# Patient Record
Sex: Female | Born: 1935 | Race: Black or African American | Hispanic: No | Smoking: Former smoker
Health system: Southern US, Community
[De-identification: ages and names within clinical notes are randomized; demographics above are authoritative.]

## PROBLEM LIST (undated history)

## (undated) DIAGNOSIS — E039 Hypothyroidism, unspecified: Secondary | ICD-10-CM

## (undated) DIAGNOSIS — H9190 Unspecified hearing loss, unspecified ear: Secondary | ICD-10-CM

## (undated) DIAGNOSIS — J90 Pleural effusion, not elsewhere classified: Secondary | ICD-10-CM

## (undated) DIAGNOSIS — R4189 Other symptoms and signs involving cognitive functions and awareness: Secondary | ICD-10-CM

## (undated) DIAGNOSIS — C801 Malignant (primary) neoplasm, unspecified: Secondary | ICD-10-CM

## (undated) DIAGNOSIS — I10 Essential (primary) hypertension: Secondary | ICD-10-CM

## (undated) DIAGNOSIS — Z9889 Other specified postprocedural states: Secondary | ICD-10-CM

## (undated) DIAGNOSIS — C50919 Malignant neoplasm of unspecified site of unspecified female breast: Secondary | ICD-10-CM

## (undated) HISTORY — DX: Other specified postprocedural states: Z98.890

## (undated) HISTORY — DX: Malignant (primary) neoplasm, unspecified: C80.1

## (undated) HISTORY — PX: CATARACT EXTRACTION: SUR2

## (undated) HISTORY — DX: Malignant neoplasm of unspecified site of unspecified female breast: C50.919

## (undated) HISTORY — PX: DILATATION & CURETTAGE/HYSTEROSCOPY WITH MYOSURE: SHX6511

## (undated) HISTORY — DX: Unspecified hearing loss, unspecified ear: H91.90

## (undated) HISTORY — DX: Other symptoms and signs involving cognitive functions and awareness: R41.89

## (undated) HISTORY — DX: Essential (primary) hypertension: I10

---

## 1998-04-02 ENCOUNTER — Ambulatory Visit (HOSPITAL_COMMUNITY): Admission: RE | Admit: 1998-04-02 | Discharge: 1998-04-02 | Payer: Self-pay | Admitting: Cardiology

## 1998-10-14 ENCOUNTER — Other Ambulatory Visit: Admission: RE | Admit: 1998-10-14 | Discharge: 1998-10-14 | Payer: Self-pay | Admitting: Obstetrics and Gynecology

## 2000-04-05 ENCOUNTER — Other Ambulatory Visit: Admission: RE | Admit: 2000-04-05 | Discharge: 2000-04-05 | Payer: Self-pay | Admitting: Obstetrics and Gynecology

## 2000-06-30 ENCOUNTER — Encounter: Payer: Self-pay | Admitting: Cardiology

## 2000-06-30 ENCOUNTER — Encounter: Admission: RE | Admit: 2000-06-30 | Discharge: 2000-06-30 | Payer: Self-pay | Admitting: Cardiology

## 2000-07-21 ENCOUNTER — Encounter: Payer: Self-pay | Admitting: Internal Medicine

## 2000-07-21 ENCOUNTER — Ambulatory Visit (HOSPITAL_COMMUNITY): Admission: RE | Admit: 2000-07-21 | Discharge: 2000-07-21 | Payer: Self-pay | Admitting: Internal Medicine

## 2000-08-26 ENCOUNTER — Encounter: Payer: Self-pay | Admitting: Internal Medicine

## 2000-08-26 DIAGNOSIS — K648 Other hemorrhoids: Secondary | ICD-10-CM | POA: Insufficient documentation

## 2000-08-30 ENCOUNTER — Ambulatory Visit (HOSPITAL_COMMUNITY): Admission: RE | Admit: 2000-08-30 | Discharge: 2000-08-30 | Payer: Self-pay | Admitting: Internal Medicine

## 2000-08-30 ENCOUNTER — Encounter: Payer: Self-pay | Admitting: Internal Medicine

## 2001-04-20 ENCOUNTER — Other Ambulatory Visit: Admission: RE | Admit: 2001-04-20 | Discharge: 2001-04-20 | Payer: Self-pay | Admitting: Obstetrics and Gynecology

## 2002-08-16 ENCOUNTER — Other Ambulatory Visit: Admission: RE | Admit: 2002-08-16 | Discharge: 2002-08-16 | Payer: Self-pay | Admitting: Obstetrics and Gynecology

## 2003-02-02 ENCOUNTER — Encounter: Admission: RE | Admit: 2003-02-02 | Discharge: 2003-02-02 | Payer: Self-pay | Admitting: Cardiology

## 2003-02-02 ENCOUNTER — Encounter: Payer: Self-pay | Admitting: Cardiology

## 2003-07-13 ENCOUNTER — Encounter (HOSPITAL_BASED_OUTPATIENT_CLINIC_OR_DEPARTMENT_OTHER): Payer: Self-pay | Admitting: General Surgery

## 2003-07-16 ENCOUNTER — Encounter (INDEPENDENT_AMBULATORY_CARE_PROVIDER_SITE_OTHER): Payer: Self-pay | Admitting: *Deleted

## 2003-07-16 ENCOUNTER — Ambulatory Visit (HOSPITAL_COMMUNITY): Admission: RE | Admit: 2003-07-16 | Discharge: 2003-07-16 | Payer: Self-pay | Admitting: General Surgery

## 2003-07-16 ENCOUNTER — Encounter (HOSPITAL_BASED_OUTPATIENT_CLINIC_OR_DEPARTMENT_OTHER): Payer: Self-pay | Admitting: General Surgery

## 2003-07-31 ENCOUNTER — Other Ambulatory Visit: Admission: RE | Admit: 2003-07-31 | Discharge: 2003-07-31 | Payer: Self-pay | Admitting: Obstetrics and Gynecology

## 2003-08-14 ENCOUNTER — Ambulatory Visit (HOSPITAL_COMMUNITY): Admission: RE | Admit: 2003-08-14 | Discharge: 2003-08-14 | Payer: Self-pay | Admitting: General Surgery

## 2003-08-14 ENCOUNTER — Encounter (INDEPENDENT_AMBULATORY_CARE_PROVIDER_SITE_OTHER): Payer: Self-pay | Admitting: Specialist

## 2003-08-17 ENCOUNTER — Ambulatory Visit: Admission: RE | Admit: 2003-08-17 | Discharge: 2003-10-24 | Payer: Self-pay | Admitting: *Deleted

## 2003-11-20 ENCOUNTER — Ambulatory Visit: Admission: RE | Admit: 2003-11-20 | Discharge: 2003-11-20 | Payer: Self-pay | Admitting: *Deleted

## 2003-11-24 HISTORY — PX: BREAST LUMPECTOMY: SHX2

## 2004-05-23 ENCOUNTER — Encounter: Admission: RE | Admit: 2004-05-23 | Discharge: 2004-05-23 | Payer: Self-pay | Admitting: Cardiology

## 2004-05-23 ENCOUNTER — Ambulatory Visit: Admission: RE | Admit: 2004-05-23 | Discharge: 2004-05-23 | Payer: Self-pay | Admitting: *Deleted

## 2004-06-06 ENCOUNTER — Ambulatory Visit: Admission: RE | Admit: 2004-06-06 | Discharge: 2004-06-06 | Payer: Self-pay | Admitting: *Deleted

## 2004-08-05 ENCOUNTER — Other Ambulatory Visit: Admission: RE | Admit: 2004-08-05 | Discharge: 2004-08-05 | Payer: Self-pay | Admitting: Obstetrics and Gynecology

## 2004-08-18 ENCOUNTER — Encounter: Admission: RE | Admit: 2004-08-18 | Discharge: 2004-11-16 | Payer: Self-pay | Admitting: Obstetrics and Gynecology

## 2004-10-31 ENCOUNTER — Encounter: Admission: RE | Admit: 2004-10-31 | Discharge: 2004-10-31 | Payer: Self-pay | Admitting: Cardiology

## 2005-05-18 ENCOUNTER — Encounter: Admission: RE | Admit: 2005-05-18 | Discharge: 2005-05-18 | Payer: Self-pay | Admitting: Cardiology

## 2005-07-06 ENCOUNTER — Emergency Department (HOSPITAL_COMMUNITY): Admission: EM | Admit: 2005-07-06 | Discharge: 2005-07-06 | Payer: Self-pay | Admitting: Family Medicine

## 2005-08-11 ENCOUNTER — Other Ambulatory Visit: Admission: RE | Admit: 2005-08-11 | Discharge: 2005-08-11 | Payer: Self-pay | Admitting: Obstetrics and Gynecology

## 2006-08-23 ENCOUNTER — Other Ambulatory Visit: Admission: RE | Admit: 2006-08-23 | Discharge: 2006-08-23 | Payer: Self-pay | Admitting: Obstetrics and Gynecology

## 2006-08-30 ENCOUNTER — Ambulatory Visit: Payer: Self-pay | Admitting: Internal Medicine

## 2006-10-11 ENCOUNTER — Encounter: Admission: RE | Admit: 2006-10-11 | Discharge: 2006-10-11 | Payer: Self-pay | Admitting: Obstetrics and Gynecology

## 2007-05-11 ENCOUNTER — Encounter: Admission: RE | Admit: 2007-05-11 | Discharge: 2007-05-11 | Payer: Self-pay | Admitting: Orthopedic Surgery

## 2007-06-28 ENCOUNTER — Encounter: Admission: RE | Admit: 2007-06-28 | Discharge: 2007-06-28 | Payer: Self-pay | Admitting: Orthopedic Surgery

## 2007-07-05 ENCOUNTER — Encounter: Admission: RE | Admit: 2007-07-05 | Discharge: 2007-08-30 | Payer: Self-pay | Admitting: Orthopedic Surgery

## 2007-08-30 ENCOUNTER — Ambulatory Visit: Payer: Self-pay | Admitting: Internal Medicine

## 2008-02-02 DIAGNOSIS — Z853 Personal history of malignant neoplasm of breast: Secondary | ICD-10-CM

## 2008-02-29 ENCOUNTER — Ambulatory Visit (HOSPITAL_COMMUNITY): Admission: RE | Admit: 2008-02-29 | Discharge: 2008-02-29 | Payer: Self-pay | Admitting: Cardiology

## 2008-05-10 ENCOUNTER — Encounter: Admission: RE | Admit: 2008-05-10 | Discharge: 2008-05-10 | Payer: Self-pay | Admitting: Orthopedic Surgery

## 2008-11-23 HISTORY — PX: CATARACT EXTRACTION, BILATERAL: SHX1313

## 2009-11-14 ENCOUNTER — Ambulatory Visit (HOSPITAL_COMMUNITY): Admission: RE | Admit: 2009-11-14 | Discharge: 2009-11-14 | Payer: Self-pay | Admitting: Ophthalmology

## 2009-11-23 HISTORY — PX: APPENDECTOMY: SHX54

## 2010-02-04 ENCOUNTER — Encounter (INDEPENDENT_AMBULATORY_CARE_PROVIDER_SITE_OTHER): Payer: Self-pay | Admitting: *Deleted

## 2010-02-04 ENCOUNTER — Inpatient Hospital Stay (HOSPITAL_COMMUNITY): Admission: EM | Admit: 2010-02-04 | Discharge: 2010-02-06 | Payer: Self-pay | Admitting: Emergency Medicine

## 2010-02-05 ENCOUNTER — Encounter (INDEPENDENT_AMBULATORY_CARE_PROVIDER_SITE_OTHER): Payer: Self-pay | Admitting: *Deleted

## 2010-02-06 ENCOUNTER — Encounter (INDEPENDENT_AMBULATORY_CARE_PROVIDER_SITE_OTHER): Payer: Self-pay | Admitting: General Surgery

## 2010-05-12 ENCOUNTER — Telehealth: Payer: Self-pay | Admitting: Internal Medicine

## 2010-12-13 ENCOUNTER — Encounter: Payer: Self-pay | Admitting: Obstetrics and Gynecology

## 2010-12-14 ENCOUNTER — Encounter: Payer: Self-pay | Admitting: Obstetrics and Gynecology

## 2010-12-25 NOTE — Op Note (Signed)
Summary: Appendectomy    NAME:  Holly Patterson, Holly Patterson           ACCOUNT NO.:  [id number]      MEDICAL RECORD NO.:  [id number]          PATIENT TYPE:  EMS      LOCATION:  MAJO                         FACILITY:  [facility]      PHYSICIAN:  Cherylynn Ridges, M.D.    DATE OF BIRTH:  January 28, 1921      DATE OF PROCEDURE:  02/06/2010   DATE OF DISCHARGE:  02/05/2010                                  OPERATIVE REPORT      PREOPERATIVE DIAGNOSIS:  Acute appendicitis.      POSTOPERATIVE DIAGNOSIS:  Acute appendicitis without perforation.      PROCEDURE:  Laparoscopic appendectomy.      SURGEON:  Marta Lamas. Lindie Spruce, MD      ANESTHESIA:  General endotracheal.      ESTIMATED BLOOD LOSS:  Less than 20 mL.      COMPLICATIONS:  None.      CONDITION:  Stable.      INDICATIONS FOR OPERATION:  The patient is an 77 year old very healthy   female with a CT scan diagnosed acute appendicitis based on the the   patient's complaints who now comes to the operating room for   laparoscopic appendectomy.      FINDINGS:  Today she had an acutely inflamed nonperforated appendix that   was mildly attached to the terminal ileum.  No evidence of any intra-   abdominal fluid or pus.      OPERATION:  The patient was taken to the operating room, placed on table   in supine position.  After an adequate general endotracheal anesthetic   was administered she was prepped and draped in usual sterile manner   exposing entire abdomen.      A supraumbilical midline incision was made using a #15 blade taken down   to the midline fascia.  We grabbed the fascia with 2 Kocher clamps and   then incised between the clamps using a 15 blade into the preperitoneal   space.      While tenting up on the fascia, we bluntly dissected down through the   preperitoneal space into the peritoneal cavity.  Once this was done a   pursestring suture of O Vicryl was passed around the fascial opening   into the peritoneal cavity securing in a Hasson  cannula, which was   subsequently passed.  Through the Hasson cannula, carbon dioxide gas was   insufflated into the peritoneal cavity up to a maximal intra-abdominal   pressure of 50 mmHg.      Once we insufflated with gas, we placed the patient in Trendelenburg,   the left-side was tilted down.  We inspected the right lower quadrant   where there appeared to be some gathering of tissue.  A 5-mm right upper   quadrant cannula was passed under direct vision and a left lower   quadrant 12-mm cannula passed under direct vision.  Once this was done,   we were able to manipulate the cecum and terminal ileum in order to   identify what appeared to be an acutely inflamed  appendix that was   somewhat attached to the terminal ileum by adhesions and inflammation.   We were able to free this up with just blunt dissection without injuring   the terminal ileum.  We dissected out the appendix itself from the base   of the cecum using dissection, making a window between the mesoappendix   and the cecum itself.  Once this was done an Endo-GIA blue 3.5-mm   closure Endo-GIA was passed across the base of the appendix and at the   cecum.  This detached it completely from the cecum.  We then passed a   2.5 mm vascular Endo GIA across the mesoappendix.  This completely   detached the appendix which we retrieved from the left lower quadrant   incision site using an Endo-GIA catch bag.  Once this was done, we   removed the appendix.  We repassed the cannula back in its place.  There   was minimal to no bleeding from the mesentery.  We irrigated with   perhaps of 50-100 mL of saline.  Once that was done, completely we   aspirated all fluid and gas from above the liver.  We used a purse-   string suture, which was in place in order to tie off the supraumbilical   fascial site.  Once this was done we aspirated all fluid and gas from   above the liver, removed all cannulas.  All counts were correct.  We    injected 0.7 Marcaine with epi at all sites.  We closed the   supraumbilical fascial site using running subcuticular stitch of 4-0   Monocryl.  The left lower quadrant cannula site was also closed with   skin site using Monocryl.  Dermabond, Steri-Strips and Tegaderm were   used to complete the dressing.  All counts were correct.               Cherylynn Ridges, M.D.            JOW/MEDQ  D:  02/06/2010  T:  02/06/2010  Job:  [id number]      cc:   Osvaldo Shipper. Spruill, M.D.      Electronically Signed by Jimmye Norman M.D. on 02/25/2010 09:57:58 PM

## 2010-12-25 NOTE — Procedures (Signed)
Summary: Soil scientist   Imported By: Sherian Rein 05/12/2010 08:59:15  _____________________________________________________________________  External Attachment:    Type:   Image     Comment:   External Document

## 2010-12-25 NOTE — Progress Notes (Signed)
Summary: Change GI Dr  Phone Note Outgoing Call   Call placed by: Lamona Curl CMA Duncan Dull),  May 12, 2010 9:09 AM Call placed to: Patient Daughter Summary of Call: I have spoken to Dr Excell Seltzer, patient's daughter who originally scheduled an appointment for patient to see Dr Juanda Chance for heme positive stools. Dr Excell Seltzer would now like me to cancel the pending appointment for tommorrow as patient will be seeing Dr Loreta Ave instead. I have cancelled the appointment. Initial call taken by: Lamona Curl CMA (AAMA),  May 12, 2010 9:11 AM

## 2011-01-07 DIAGNOSIS — R9389 Abnormal findings on diagnostic imaging of other specified body structures: Secondary | ICD-10-CM | POA: Diagnosis present

## 2011-02-13 LAB — COMPREHENSIVE METABOLIC PANEL
ALT: 27 U/L (ref 0–35)
AST: 30 U/L (ref 0–37)
CO2: 26 mEq/L (ref 19–32)
Calcium: 9.1 mg/dL (ref 8.4–10.5)
Chloride: 105 mEq/L (ref 96–112)
Creatinine, Ser: 1.61 mg/dL — ABNORMAL HIGH (ref 0.4–1.2)
GFR calc non Af Amer: 30 mL/min — ABNORMAL LOW (ref >60)
Glucose, Bld: 169 mg/dL — ABNORMAL HIGH (ref 70–99)
Total Bilirubin: 0.8 mg/dL (ref 0.3–1.2)

## 2011-02-13 LAB — CBC
Hemoglobin: 10.7 g/dL — ABNORMAL LOW (ref 12.0–15.0)
MCHC: 31.9 g/dL (ref 30.0–36.0)
MCHC: 32.8 g/dL (ref 30.0–36.0)
MCV: 76.5 fL — ABNORMAL LOW (ref 78.0–100.0)
MCV: 76.7 fL — ABNORMAL LOW (ref 78.0–100.0)
Platelets: 173 10*3/uL (ref 150–400)
RBC: 3.74 MIL/uL — ABNORMAL LOW (ref 3.87–5.11)
RBC: 4.36 MIL/uL (ref 3.87–5.11)
WBC: 12.5 10*3/uL — ABNORMAL HIGH (ref 4.0–10.5)
WBC: 9.1 10*3/uL (ref 4.0–10.5)

## 2011-02-13 LAB — DIFFERENTIAL
Basophils Absolute: 0 10*3/uL (ref 0.0–0.1)
Eosinophils Absolute: 0 10*3/uL (ref 0.0–0.7)
Eosinophils Relative: 0 % (ref 0–5)
Lymphs Abs: 1.2 10*3/uL (ref 0.7–4.0)
Neutrophils Relative %: 88 % — ABNORMAL HIGH (ref 43–77)

## 2011-02-13 LAB — URINALYSIS, ROUTINE W REFLEX MICROSCOPIC
Glucose, UA: NEGATIVE mg/dL
Leukocytes, UA: NEGATIVE
Specific Gravity, Urine: 1.024 (ref 1.005–1.030)
pH: 5 (ref 5.0–8.0)

## 2011-02-13 LAB — BASIC METABOLIC PANEL
BUN: 23 mg/dL (ref 6–23)
CO2: 27 mEq/L (ref 19–32)
Calcium: 8.3 mg/dL — ABNORMAL LOW (ref 8.4–10.5)
Chloride: 105 mEq/L (ref 96–112)
Creatinine, Ser: 1.33 mg/dL — ABNORMAL HIGH (ref 0.4–1.2)
GFR calc Af Amer: 46 mL/min — ABNORMAL LOW (ref >60)
Glucose, Bld: 129 mg/dL — ABNORMAL HIGH (ref 70–99)

## 2011-02-13 LAB — URINE MICROSCOPIC-ADD ON

## 2011-02-13 LAB — PROTIME-INR: Prothrombin Time: 13.6 seconds (ref 11.6–15.2)

## 2011-02-13 LAB — TYPE AND SCREEN
ABO/RH(D): A POS
Antibody Screen: NEGATIVE

## 2011-02-13 LAB — ABO/RH: ABO/RH(D): A POS

## 2011-02-13 LAB — LIPASE, BLOOD: Lipase: 24 U/L (ref 11–59)

## 2011-02-23 LAB — COMPREHENSIVE METABOLIC PANEL
AST: 28 U/L (ref 0–37)
Albumin: 3.5 g/dL (ref 3.5–5.2)
BUN: 22 mg/dL (ref 6–23)
Creatinine, Ser: 1.43 mg/dL — ABNORMAL HIGH (ref 0.4–1.2)
GFR calc Af Amer: 42 mL/min — ABNORMAL LOW (ref >60)
Total Protein: 6.7 g/dL (ref 6.0–8.3)

## 2011-02-23 LAB — CBC
HCT: 33.8 % — ABNORMAL LOW (ref 36.0–46.0)
MCV: 75.8 fL — ABNORMAL LOW (ref 78.0–100.0)
Platelets: 191 10*3/uL (ref 150–400)
RDW: 15.4 % (ref 11.5–15.5)

## 2011-02-23 LAB — URINALYSIS, ROUTINE W REFLEX MICROSCOPIC
Glucose, UA: NEGATIVE mg/dL
Hgb urine dipstick: NEGATIVE
Nitrite: NEGATIVE
Specific Gravity, Urine: 1.025 (ref 1.005–1.030)
pH: 5.5 (ref 5.0–8.0)

## 2011-02-23 LAB — DIFFERENTIAL
Lymphocytes Relative: 23 % (ref 12–46)
Lymphs Abs: 1.2 10*3/uL (ref 0.7–4.0)
Monocytes Absolute: 0.5 10*3/uL (ref 0.1–1.0)
Monocytes Relative: 9 % (ref 3–12)
Neutro Abs: 3.3 10*3/uL (ref 1.7–7.7)

## 2011-03-31 ENCOUNTER — Inpatient Hospital Stay (HOSPITAL_COMMUNITY): Admission: RE | Admit: 2011-03-31 | Payer: Self-pay | Source: Ambulatory Visit | Admitting: Obstetrics and Gynecology

## 2011-03-31 ENCOUNTER — Ambulatory Visit (HOSPITAL_COMMUNITY): Admission: RE | Admit: 2011-03-31 | Payer: Self-pay | Source: Ambulatory Visit | Admitting: Obstetrics and Gynecology

## 2011-04-07 NOTE — Assessment & Plan Note (Signed)
[facility]                         GASTROENTEROLOGY OFFICE NOTE   Holly Patterson, Holly Patterson                  MRN:          [id number]  DATE:08/30/2007                            DOB:          August 20, 1921    [name] is an 77 year old African American female who we saw in the  past for functional constipation.  She has a personal history of breast  cancer and had an entirely normal colonoscopy in 2001.  At the last  visit a year ago, we decided that we were going to call her back for a  recall colonoscopy this fall.  This fall, the patient is feeling fine.  There is no constipation; her bowel habits are once or twice a day.  She  denies rectal bleeding or abdominal pain.  Her appetite has been good.   MEDICATIONS:  1. Synthroid 50/75 mg p.o. every other day.  2. Fosamax 70 mg weekly.  3. Benicar 25/12 mg every 3 days.  4. Vitamins.   PHYSICAL EXAMINATION:  Blood pressure 122/68, pulse 64 and weight 131  pounds.  The patient was alert and oriented, in no distress.  LUNGS:  Clear to auscultation.  COR:  Normal S1 and S2.  ABDOMEN:  Soft and nontender with normoactive bowel sounds.  RECTAL:  Exam today shows normal to slightly decreased rectal tone.  Stool was felt Hemoccult-negative.   IMPRESSION:  [age]-year-old African American female with personal  history of breast cancer and normal colonoscopy in 2001.  Her recall  colonoscopy has been between 7-10 years.  Since she is asymptomatic and  heme-negative, I do not think there is any need to do the colonoscopy at  9 years.  A 10-year recall would be at the age of 50, which is probably  not necessary at that age to do a colonoscopy.  We have discussed it  today and she is perfectly comfortable with the decision.  She will call  us if she has any gastrointestinal issues.     Hedwig Morton. Juanda Chance, MD  Electronically Signed    DMB/MedQ  DD: 08/30/2007  DT: 08/31/2007  Job #: [id number]   cc:    Osvaldo Shipper. Spruill, M.D.

## 2011-04-10 NOTE — Op Note (Signed)
NAME:  TERRYE, DOMBROSKY                     ACCOUNT NO.:  [id number]   MEDICAL RECORD NO.:  [id number]                   PATIENT TYPE:  OIB   LOCATION:  2899                                 FACILITY:  [facility]   PHYSICIAN:  Leonie Man, M.D.                DATE OF BIRTH:  May 03, 1921   DATE OF PROCEDURE:  07/16/2003  DATE OF DISCHARGE:                                 OPERATIVE REPORT   PREOPERATIVE DIAGNOSIS:  Invasive lobular carcinoma of the right breast.   POSTOPERATIVE DIAGNOSIS:  Invasive lobular carcinoma of the right breast.   PROCEDURE:  Right lumpectomy with sentinel lymph node dissection.   SURGEON:  Leonie Man, M.D.   ASSISTANT:  Nurse.   ANESTHESIA:  General.   INDICATIONS FOR PROCEDURE:  This patient is a 77 year old woman who on  routine mammogram is noted to have an irregular spiculated lesion in the 10  o'clock axis of the right breast approximately 6 cm from the right nipple.  She underwent an ultrasound guided core biopsy of the lesion which shows an  infiltrating lobular carcinoma. She comes to the operating room now for  needle localization and a radionucleotide injection for right-sided  lumpectomy with sentinel lymph node dissection and possible axillary lymph  node dissection. All the risks and potential benefits of surgery have been  fully discussed. All questions were answered and consent was obtained for  surgery.   DESCRIPTION OF PROCEDURE:  Following the induction of satisfactory general  endotracheal anesthesia the periareolar region was infiltrated with 4 mL of  Lymphazurin dye and massaged into the lymphatics of  the right breast. It  should be noted that had previously been injected with radionucleotide dye.  The right breast was then prepped and draped to be included  in the sterile  operative field.   I used the Neoprobe to locate an area in the axilla with high counts and  marked that area so as to use that to guide me towards the  sentinel node.  The localizing needle was then incised down upon with a small elliptical  incision. It was deepened through the skin and subcutaneous tissue and  dissection was carried down around the needle, carrying  the dissection all  the way down to the pectoralis muscle and to the serratus anterior muscles.   The entire lump was removed and forwarded for radiologic evaluation.  Specimen mammography showed the lesion to be well contained within the  specimen removed. The specimen was then forwarded to pathology for  pathologic evaluation. Touch preps on the specimen showed no evidence of  tumor at the margins.   A right axillary incision was made at approximately  the end of the axillary  hair line, deepening this through the skin and subcutaneous tissue and using  the Neoprobe to guide me toward the sentinel node. The highest counts  received were approximately  1500 to 1700 in the area of a  blue, hot node.  This node was dissected free from the surrounding  tissues and forwarded for  pathologic evaluation. Touch prep on this node was negative for metastatic  tumor.   Another node which was not blue or hot was located right next  to the  sentinel node. This was easily removed, so it was removed and forwarded for  pathologic evaluation  and it will be on permanents.   Both the axillary and breast wounds were thoroughly checked for hemostasis.  Additional bleeding points were treated with electrocautery. All sponge,  instrument and sharp counts were doubly verified. The subcutaneous tissues  of the axilla were closed with interrupted 3-0 Vicryl suture. The skin was  closed with a running 5-0 Monocryl. Also the subcutaneous tissues of the  breast were closed with interrupted 3-0 Vicryl sutures and the skin was  closed with a 5-0 running Monocryl suture. Both wounds were reinforced with  Steri-Strips. Sterile compressive dressings were applied.   The anesthesia was reversed. The  patient was removed from the operating room  to the recovery room in stable condition. She tolerated  the procedure well.                                               Leonie Man, M.D.    PB/MEDQ  D:  07/16/2003  T:  07/16/2003  Job:  [id number]   cc:   Jeralyn Ruths, M.D.

## 2011-04-10 NOTE — Assessment & Plan Note (Signed)
[facility]                           GASTROENTEROLOGY OFFICE NOTE   [name], [name]                  MRN:          [id number]  DATE:08/30/2006                            DOB:          1921/10/06    Holly Patterson is a delightful 77 year old patient of Dr. Shana Chute who comes  today for checkup of her GI tract.  We saw her initially on October 2001 for  colorectal screening.  Colonoscopy at that time was essentially normal.  She  since then has developed breast cancer and was treated by Dr. Darnelle Catalan.  She  underwent right lumpectomy and sentinel lymph node biopsy in August 2004.  She had radiation therapy following that.   She is due for colonoscopy in October 2008.  The patient has been on  Synthroid 50/75 mcg daily, Fosamax 70 mg weekly and Benicar 25/12 mg every  three days.  She denies some dysphagia, odynophagia, symptoms of reflux or  chest pain.   PHYSICAL EXAMINATION:  Blood pressure 106/62, pulse 76 and weight 135 pounds  which is exactly the same weight as in 2001.  Lungs were clear to  auscultation.  Cor with normal S1 and normal S2.  Abdomen was soft and  nontender with normal active bowel sounds.  Liver edge at costal margin.  Abdomen was normal.  Rectal exam showed soft, hemoccult negative stool.   IMPRESSION:  An 77 year old African American female with history of breast  cancer, due for colonoscopy October 2008.  She is currently heme negative.  There was a transient episode of constipation related to some of her  medications but she is back to normal bowel habits.   PLAN:  High fiber diet.  Colonoscopy October 2008.  She will receive a  recall letter from [place].       Holly Patterson. Juanda Chance, MD      DMB/MedQ  DD:  08/30/2006  DT:  08/31/2006  Job #:  [id number]   cc:   Osvaldo Shipper. Spruill, M.D.

## 2011-04-10 NOTE — Op Note (Signed)
   NAME:  Holly Patterson, Holly Patterson                     ACCOUNT NO.:  [id number]   MEDICAL RECORD NO.:  [id number]                   PATIENT TYPE:  OIB   LOCATION:  2899                                 FACILITY:  [facility]   PHYSICIAN:  Leonie Man, M.D.                DATE OF BIRTH:  11-06-1921   DATE OF PROCEDURE:  08/14/2003  DATE OF DISCHARGE:  08/14/2003                                 OPERATIVE REPORT   PREOPERATIVE DIAGNOSIS:  Residual carcinoma, right breast following  lumpectomy.   POSTOPERATIVE DIAGNOSIS:  Residual carcinoma, right breast following  lumpectomy, pathology pending.   OPERATION PERFORMED:  Re-excision of right lumpectomy site.   SURGEON:  Leonie Man, M.D.   ASSISTANT:  Nurse.   ANESTHESIA:  General.   INDICATIONS FOR PROCEDURE:  The patient is a 77 year old woman who underwent  lumpectomy and sentinel lymph node biopsy on July 16, 2003.  Pathology  reads that invasive __________ carcinoma approaches the anterior margin.  The patient returns now for re-excision of her biopsy site to complete total  excision of cancer.   DESCRIPTION OF PROCEDURE:  Following induction of satisfactory general  anesthesia, the patient was positioned supinely and the right breast was  prepped and draped  to be included in a sterile operative field.  An  elliptical incision was made around the old scar, deepening this through the  skin down to the subcutaneous tissues.  Dissection was carried superiorly to  the previous biopsy site.  The tissue was re-excised re-excising the entire  biopsy site down to the region of the pectoralis major muscles.  The  specimen was removed and the anterior margin marked with a long suture. The  3 o'clock margin marked with a double suture.  This was then forwarded for  pathologic evaluation.  Hemostasis was assured with electrocautery.  Sponge  and instrument counts were verified.  The wound closed in two layers using  interrupted 3-0 Vicryl  sutures in the subcutaneous tissues and 5-0 Monocryl  suture in the skin.  The wounds were reinforced with Steri-Strips.  Sterile  dressings were applied.  Anesthetic was reversed and the patient removed  from the operating room to the recovery room in stable condition.  She  tolerated the procedure well.                                                Leonie Man, M.D.    PB/MEDQ  D:  08/14/2003  T:  08/15/2003  Job:  [id number]

## 2011-05-07 ENCOUNTER — Other Ambulatory Visit: Payer: Self-pay | Admitting: Obstetrics and Gynecology

## 2011-05-07 ENCOUNTER — Encounter (HOSPITAL_COMMUNITY): Payer: Medicare Other | Attending: Obstetrics and Gynecology

## 2011-05-07 DIAGNOSIS — Z01812 Encounter for preprocedural laboratory examination: Secondary | ICD-10-CM | POA: Insufficient documentation

## 2011-05-07 DIAGNOSIS — Z01818 Encounter for other preprocedural examination: Secondary | ICD-10-CM | POA: Insufficient documentation

## 2011-05-07 LAB — CBC
HCT: 34 % — ABNORMAL LOW (ref 36.0–46.0)
Hemoglobin: 10.8 g/dL — ABNORMAL LOW (ref 12.0–15.0)
MCH: 24 pg — ABNORMAL LOW (ref 26.0–34.0)
MCV: 75.6 fL — ABNORMAL LOW (ref 78.0–100.0)
RBC: 4.5 MIL/uL (ref 3.87–5.11)

## 2011-05-15 ENCOUNTER — Ambulatory Visit (HOSPITAL_COMMUNITY)
Admission: RE | Admit: 2011-05-15 | Payer: Medicare Other | Source: Ambulatory Visit | Admitting: Obstetrics and Gynecology

## 2011-05-15 ENCOUNTER — Ambulatory Visit (HOSPITAL_COMMUNITY): Admission: RE | Admit: 2011-05-15 | Payer: Self-pay | Source: Ambulatory Visit | Admitting: Obstetrics and Gynecology

## 2011-06-01 ENCOUNTER — Other Ambulatory Visit (HOSPITAL_COMMUNITY): Payer: Self-pay | Admitting: Cardiology

## 2011-06-02 NOTE — H&P (Signed)
NAMEFRANCELY, Patterson NO.:  [id number]  MEDICAL RECORD NO.:  [id number]  LOCATION:                                 FACILITY:  PHYSICIAN:  Hal Morales, M.D.DATE OF BIRTH:  02/11/1921  DATE OF ADMISSION:  05/15/2011 DATE OF DISCHARGE:                             HISTORY & PHYSICAL   HISTORY OF PRESENT ILLNESS:  The patient is an 77 year old black widowed female, para 3-0-0-3 who presents for evaluation of fluid intracavitary on uterine ultrasound.  The patient is many years postmenopausal and has had no vaginal bleeding.  She has a remote history of low-grade squamous intraepithelial lesion which was treated with cold knife conization of the cervix with negative margins and has had normal Pap smears since that time.  She was seen in February 2012 because of progressive weight loss and underwent a pelvic ultrasound to rule out gynecologic contribution for abnormalities.  At that time, the uterus was notsignificantly enlarged at 5.3 x 4.5 cm and the ovaries were normal bilaterally.  There was no free fluid within the cul-de-sac. Endometrial cavity was filled with fluid and an endometrial thickness of 3.7 mm with an endometrial mass measuring 5.5 x 3.4 mm.  There was fluid filling the endometrial cavity.  In light of these findings, it was recommended that the patient undergo hysteroscopy and cytology of the endometrial fluid along with resection of the endometrial mass which had been noted.  The patient had delayed that procedure for several reasons, but presents now for that evaluation.  PAST MEDICAL HISTORY:  Obstetrical:  The patient has had 3 normal spontaneous vaginal deliveries.  Gynecologic:  The patient had a conization of the cervix in 1991 with noninvasive disease and clear margins.  Pap smears have been normal since that time.  The patient also has a history of lichen sclerosus on biopsy which has caused hypopigmentation of the vulvar area, but  no other symptomatology.  The patient was diagnosed with a right breast cancer in 2004 and underwent lumpectomy and no further therapy.  The patient had osteopenia diagnosed in 2005 on DEXA scan and was noted to have a chronic midthoracic vertebral compression fracture.  She was started on Fosamax at that time.  She has had good preservation of her bone density in the spine end.  MEDICAL HISTORY:  The patient was diagnosed with hyperthyroidism in 2001 and was treated with I-131 therapy and is now chronically on Synthroid with euthyroid serology.  She is treated for chronic hypertension.  SURGICAL HISTORY:  The patient underwent an appendectomy in March 2011 without difficulty.  CURRENT MEDICATIONS:  Benicar, Synthroid, Fosamax, calcium, and vitamin D.  DRUG ALLERGIES:  No known drug allergies.  FAMILY HISTORY:  Positive for sister with breast cancer, father with prostatic cancer, and grandmother with ovarian cancer as well as mother with breast cancer.  She also has a positive history for diabetes in her sister.  REVIEW OF SYSTEMS:  Significant for poor appetite and weight loss with approximately 17-pound weight loss noted in the last year.  SOCIAL HISTORY:  The patient is widowed.  She is a retired Radio producer.  She lives in her own home and her granddaughter lives with  her who is a Archivist.  PHYSICAL EXAMINATION:  GENERAL:  The patient is a thin black female in no acute distress. VITAL SIGNS:  Temperature 98, respirations 20, pulse 72, blood pressure is 120/64, and weight is 117 pounds. LUNGS:  Clear. HEART:  Regular rate and rhythm. ABDOMEN:  Soft without masses or organomegaly. PELVIC:  EGBUS shows hypopigmented area over the vulva which is stable. The vagina is atrophic.  The cervix is stenotic.  The uterus is normal size, shape, consistency, mobile, and nontender.  Adnexa, no masses.  IMPRESSION: 1. Endometrial fluid and endometrial mass noted on  ultrasound. 2. Weight loss. 3. History of breast cancer. 4. History of low-grade squamous intraepithelial lesion status post     conization of the cervix.  DISPOSITION:  Discussion was held with the patient and with her daughter and son concerning indications for her procedure as well as the risks involved which include but are not limited to anesthesia, bleeding, infection, and damage to adjacent organs.  This is particularly important in the light of the patient's stenotic cervix.  The importance of this endometrial evaluation has been reviewed and consent has been given by the patient and her children.  The procedure will be performed at [facility] on May 15, 2011.     Hal Morales, M.D.     VPH/MEDQ  D:  05/12/2011  T:  05/13/2011  Job:  [id number]  Electronically Signed by Dierdre Forth M.D. on 06/02/2011 06:59:23 AM

## 2011-06-08 ENCOUNTER — Ambulatory Visit (HOSPITAL_COMMUNITY): Payer: Medicare Other

## 2011-06-08 ENCOUNTER — Encounter (HOSPITAL_COMMUNITY)
Admission: RE | Admit: 2011-06-08 | Discharge: 2011-06-08 | Disposition: A | Discharge status: Home / Self Care | Payer: Medicare Other | Source: Ambulatory Visit | Attending: Cardiology | Admitting: Cardiology

## 2011-06-08 DIAGNOSIS — R079 Chest pain, unspecified: Secondary | ICD-10-CM | POA: Insufficient documentation

## 2011-06-08 MED ORDER — TECHNETIUM TC 99M TETROFOSMIN IV KIT
30.0000 | PACK | Freq: Once | INTRAVENOUS | Status: AC | PRN
  Administered 2011-06-08: 30 via INTRAVENOUS

## 2011-06-08 MED ORDER — TECHNETIUM TC 99M TETROFOSMIN IV KIT
10.0000 | PACK | Freq: Once | INTRAVENOUS | Status: AC | PRN
  Administered 2011-06-08: 10 via INTRAVENOUS

## 2011-06-11 ENCOUNTER — Encounter (INDEPENDENT_AMBULATORY_CARE_PROVIDER_SITE_OTHER): Payer: Self-pay | Admitting: Surgery

## 2011-06-16 ENCOUNTER — Other Ambulatory Visit: Payer: Self-pay | Admitting: Obstetrics and Gynecology

## 2011-06-19 ENCOUNTER — Telehealth (INDEPENDENT_AMBULATORY_CARE_PROVIDER_SITE_OTHER): Payer: Self-pay | Admitting: Surgery

## 2011-06-19 ENCOUNTER — Ambulatory Visit (INDEPENDENT_AMBULATORY_CARE_PROVIDER_SITE_OTHER): Payer: Medicare Other | Admitting: Surgery

## 2011-06-19 ENCOUNTER — Encounter (INDEPENDENT_AMBULATORY_CARE_PROVIDER_SITE_OTHER): Payer: Self-pay | Admitting: Surgery

## 2011-06-19 VITALS — BP 114/64 | HR 60 | Ht 62.0 in | Wt 124.0 lb

## 2011-06-19 DIAGNOSIS — R928 Other abnormal and inconclusive findings on diagnostic imaging of breast: Secondary | ICD-10-CM

## 2011-06-19 DIAGNOSIS — R921 Mammographic calcification found on diagnostic imaging of breast: Secondary | ICD-10-CM

## 2011-06-19 NOTE — Patient Instructions (Signed)
We will work with Dr. Lilian Coma office to coordinate our surgery with hers.

## 2011-06-19 NOTE — Progress Notes (Signed)
Holly Patterson is a 77 y.o. female.    Chief Complaint  Patient presents with  . eval breast scarring    HPI HPI This patient was operated on in 2004 by Holly Patterson for a 0.8 cm grade 1 invasive lobular carcinoma. The original excision it extended focally to the anterior margin and she subsequently underwent a reexcision which had no residual cancer. Two sentinel lymph nodes were negative.  On routine followup she has been developing a tiny bit of abnormal calcifications in the area of the prior lumpectomy. She would be a difficult for a core biopsy because of the location. The radiologist suggested either BSGI or needle locator excision. The patient would like to have an excision. She is already scheduled for surgery by Dr. Penni Bombard for a hysteroscopy on August 16 and would like to have the surgery done at the same time. She is having no current symptoms with her breast.  Past Medical History  Diagnosis Date  . History of conization of cervix   . Cancer rt breast cancer 2004  . Hyperthyroidism     in 2001  . Hypertension     Past Surgical History  Procedure Date  . Appendectomy 2011  . Cataract extraction, bilateral 2010  . Breast lumpectomy 2005    Family History  Problem Relation Age of Onset  . Prostate cancer Father   . Nephrotic syndrome Sister   . Breast cancer Sister   . Breast cancer Mother   . Pancreatic cancer Sister     Social History History  Substance Use Topics  . Smoking status: Never Smoker   . Smokeless tobacco: Not on file  . Alcohol Use: Yes     "wine once or twice a month"    No Known Allergies  Current Outpatient Prescriptions  Medication Sig Dispense Refill  . alendronate (FOSAMAX) 10 MG tablet Take 10 mg by mouth daily before breakfast. Take with a full glass of water on an empty stomach.       . Calcium Carbonate-Vitamin D (CALCIUM-VITAMIN D) 500-200 MG-UNIT per tablet Take 1 tablet by mouth 2 (two) times daily with a meal.        .  levothyroxine (SYNTHROID, LEVOTHROID) 100 MCG tablet Take 100 mcg by mouth daily.        Marland Kitchen olmesartan (BENICAR) 20 MG tablet Take 20 mg by mouth daily.        . calcium & magnesium carbonates (MYLANTA) 311-232 MG per tablet Take 1 tablet by mouth daily.        . EXELON 4.6 MG/24HR         Review of Systems Review of Systems  Constitutional: Negative.   HENT: Negative.   Eyes: Negative.   Respiratory: Negative.   Gastrointestinal: Negative.   Genitourinary: Negative.   Skin: Negative.   Neurological: Negative.   Endo/Heme/Allergies: Negative.   Psychiatric/Behavioral: Positive for memory loss.    Physical Exam Physical Exam  Constitutional: She is oriented to person, place, and time. She appears well-developed and well-nourished.  HENT:  Head: Normocephalic and atraumatic.  Eyes: Pupils are equal, round, and reactive to light. No scleral icterus.  Neck: Neck supple. No tracheal deviation present. No thyromegaly present.  Cardiovascular: Normal rate and regular rhythm.   Respiratory: Effort normal and breath sounds normal.    GI: Soft. She exhibits no distension. There is no tenderness. There is no rebound.  Musculoskeletal: Normal range of motion.  Neurological: She is alert and oriented to person, place, and  time.  Skin: Skin is warm and dry.     Blood pressure 114/64, pulse 60, height 5\' 2"  (1.575 m), weight 124 lb (56.246 kg). Data reviewed: I've reviewed our old office records which the information from when she was operated on previously for her breast cancer. I have also reviewed the reports and films of her mammography. Assessment/Plan Impression: Abnormal calcifications developing in a site of a prior lumpectomy for ILC. Plan: I think it would be reasonable to do a needle guided excisional biopsy. I discussed the patient as well as her daughter. Spoke with her son about this by phone and will talk to him again later today. Think all questions have been  answered. [name] J 06/19/2011, 12:19 PM

## 2011-06-22 ENCOUNTER — Telehealth (INDEPENDENT_AMBULATORY_CARE_PROVIDER_SITE_OTHER): Payer: Self-pay

## 2011-06-22 NOTE — Telephone Encounter (Signed)
I received a call from [facility] at  Dr Corine Shelter office in [place] and Dr Corine Shelter would like Dr Jamey Ripa to call the dr at his convenience [phone]

## 2011-06-23 NOTE — Telephone Encounter (Signed)
Never got a responds back, de

## 2011-07-03 ENCOUNTER — Encounter (HOSPITAL_COMMUNITY)
Admission: RE | Admit: 2011-07-03 | Discharge: 2011-07-03 | Disposition: A | Discharge status: Home / Self Care | Payer: Medicare Other | Source: Ambulatory Visit | Attending: Obstetrics and Gynecology | Admitting: Obstetrics and Gynecology

## 2011-07-03 ENCOUNTER — Encounter (HOSPITAL_COMMUNITY): Payer: Self-pay

## 2011-07-03 LAB — CBC
HCT: 33.4 % — ABNORMAL LOW (ref 36.0–46.0)
MCV: 76.8 fL — ABNORMAL LOW (ref 78.0–100.0)
RDW: 15.1 % (ref 11.5–15.5)
WBC: 5.5 10*3/uL (ref 4.0–10.5)

## 2011-07-03 LAB — BASIC METABOLIC PANEL
BUN: 21 mg/dL (ref 6–23)
Chloride: 104 mEq/L (ref 96–112)
Creatinine, Ser: 1.42 mg/dL — ABNORMAL HIGH (ref 0.50–1.10)
GFR calc Af Amer: 42 mL/min — ABNORMAL LOW (ref >60)

## 2011-07-03 NOTE — Patient Instructions (Addendum)
20 Scherrie D Plotz  07/03/2011   Your procedure is scheduled on:  07/09/11  Enter through the Main Entrance of [facility] at 830 AM.  Pick up the phone at the desk and dial [phone].   Call this number if you have problems the morning of surgery: [phone]   Remember:   Do not eat food:After Midnight.  Do not drink clear liquids: After Midnight.  Take these medicines the morning of surgery with A SIP OF WATER: BP medication   Do not wear jewelry, make-up or nail polish.  Do not wear lotions, powders, or perfumes. You may wear deodorant.  Do not shave 48 hours prior to surgery.  Do not bring valuables to the hospital.  Contacts, dentures or bridgework may not be worn into surgery.  Leave suitcase in the car. After surgery it may be brought to your room.  For patients admitted to the hospital, checkout time is 11:00 AM the day of discharge.   Patients discharged the day of surgery will not be allowed to drive home.  Name and phone number of your driver: daughter   Virgina Jock  Special Instructions: use CHG wash per written instruction sheet   Please read over the following fact sheets that you were given: NA

## 2011-07-09 ENCOUNTER — Encounter (HOSPITAL_COMMUNITY): Payer: Self-pay | Admitting: Certified Registered"

## 2011-07-09 ENCOUNTER — Other Ambulatory Visit: Payer: Self-pay | Admitting: Obstetrics and Gynecology

## 2011-07-09 ENCOUNTER — Ambulatory Visit (HOSPITAL_COMMUNITY)
Admission: RE | Admit: 2011-07-09 | Discharge: 2011-07-09 | Disposition: A | Discharge status: Home / Self Care | Payer: Medicare Other | Source: Ambulatory Visit | Attending: Obstetrics and Gynecology | Admitting: Obstetrics and Gynecology

## 2011-07-09 ENCOUNTER — Ambulatory Visit (HOSPITAL_COMMUNITY): Payer: Medicare Other | Admitting: Certified Registered"

## 2011-07-09 ENCOUNTER — Encounter (HOSPITAL_COMMUNITY)
Admission: RE | Disposition: A | Discharge status: Home / Self Care | Payer: Self-pay | Source: Ambulatory Visit | Attending: Obstetrics and Gynecology

## 2011-07-09 DIAGNOSIS — R9389 Abnormal findings on diagnostic imaging of other specified body structures: Secondary | ICD-10-CM | POA: Diagnosis present

## 2011-07-09 DIAGNOSIS — Z01818 Encounter for other preprocedural examination: Secondary | ICD-10-CM | POA: Insufficient documentation

## 2011-07-09 DIAGNOSIS — R928 Other abnormal and inconclusive findings on diagnostic imaging of breast: Secondary | ICD-10-CM | POA: Insufficient documentation

## 2011-07-09 DIAGNOSIS — N882 Stricture and stenosis of cervix uteri: Secondary | ICD-10-CM | POA: Insufficient documentation

## 2011-07-09 DIAGNOSIS — E039 Hypothyroidism, unspecified: Secondary | ICD-10-CM | POA: Insufficient documentation

## 2011-07-09 DIAGNOSIS — D0590 Unspecified type of carcinoma in situ of unspecified breast: Secondary | ICD-10-CM

## 2011-07-09 DIAGNOSIS — C50511 Malignant neoplasm of lower-outer quadrant of right female breast: Secondary | ICD-10-CM | POA: Insufficient documentation

## 2011-07-09 DIAGNOSIS — Z01812 Encounter for preprocedural laboratory examination: Secondary | ICD-10-CM | POA: Insufficient documentation

## 2011-07-09 DIAGNOSIS — I10 Essential (primary) hypertension: Secondary | ICD-10-CM | POA: Insufficient documentation

## 2011-07-09 HISTORY — PX: BREAST BIOPSY: SHX20

## 2011-07-09 SURGERY — DILATATION & CURETTAGE/HYSTEROSCOPY WITH RESECTOCOPE
Anesthesia: General | Site: Vagina | Laterality: Right | Wound class: Clean Contaminated

## 2011-07-09 MED ORDER — LIDOCAINE HCL 2 % IJ SOLN
INTRAMUSCULAR | Status: DC | PRN
  Administered 2011-07-09: 10 mL via INTRADERMAL

## 2011-07-09 MED ORDER — LACTATED RINGERS IV SOLN
INTRAVENOUS | Status: DC
  Administered 2011-07-09 (×2): via INTRAVENOUS

## 2011-07-09 MED ORDER — FENTANYL CITRATE 0.05 MG/ML IJ SOLN: 25.0000 ug | INTRAMUSCULAR | Status: DC | PRN

## 2011-07-09 MED ORDER — IBUPROFEN 200 MG PO TABS: 600.0000 mg | ORAL_TABLET | Freq: Four times a day (QID) | ORAL | Status: AC | PRN

## 2011-07-09 MED ORDER — EPHEDRINE 5 MG/ML INJ
INTRAVENOUS | Status: AC
  Filled 2011-07-09: qty 10

## 2011-07-09 MED ORDER — FENTANYL CITRATE 0.05 MG/ML IJ SOLN
INTRAMUSCULAR | Status: DC | PRN
  Administered 2011-07-09 (×2): 25 ug via INTRAVENOUS
  Administered 2011-07-09 (×2): 50 ug via INTRAVENOUS

## 2011-07-09 MED ORDER — PHENYLEPHRINE 40 MCG/ML (10ML) SYRINGE FOR IV PUSH (FOR BLOOD PRESSURE SUPPORT)
PREFILLED_SYRINGE | INTRAVENOUS | Status: AC
  Filled 2011-07-09: qty 5

## 2011-07-09 MED ORDER — DEXAMETHASONE SODIUM PHOSPHATE 10 MG/ML IJ SOLN
INTRAMUSCULAR | Status: AC
  Filled 2011-07-09: qty 1

## 2011-07-09 MED ORDER — HYDROCODONE-ACETAMINOPHEN 5-500 MG PO TABS: ORAL_TABLET | ORAL | Status: AC

## 2011-07-09 MED ORDER — LIDOCAINE HCL (CARDIAC) 20 MG/ML IV SOLN
INTRAVENOUS | Status: AC
  Filled 2011-07-09: qty 5

## 2011-07-09 MED ORDER — DEXAMETHASONE SODIUM PHOSPHATE 10 MG/ML IJ SOLN
INTRAMUSCULAR | Status: DC | PRN
  Administered 2011-07-09: 4 mg via INTRAVENOUS

## 2011-07-09 MED ORDER — FENTANYL CITRATE 0.05 MG/ML IJ SOLN
INTRAMUSCULAR | Status: AC
  Filled 2011-07-09: qty 2

## 2011-07-09 MED ORDER — PROPOFOL 10 MG/ML IV EMUL
INTRAVENOUS | Status: DC | PRN
  Administered 2011-07-09: 100 mg via INTRAVENOUS

## 2011-07-09 MED ORDER — PROPOFOL 10 MG/ML IV EMUL
INTRAVENOUS | Status: AC
  Filled 2011-07-09: qty 20

## 2011-07-09 MED ORDER — GLYCOPYRROLATE 0.2 MG/ML IJ SOLN
INTRAMUSCULAR | Status: AC
  Filled 2011-07-09: qty 1

## 2011-07-09 MED ORDER — LIDOCAINE HCL (CARDIAC) 20 MG/ML IV SOLN
INTRAVENOUS | Status: DC | PRN
  Administered 2011-07-09: 40 mg via INTRAVENOUS

## 2011-07-09 MED ORDER — PHENYLEPHRINE HCL 10 MG/ML IJ SOLN
INTRAMUSCULAR | Status: DC | PRN
  Administered 2011-07-09 (×5): 40 ug via INTRAVENOUS

## 2011-07-09 MED ORDER — MEPERIDINE HCL 25 MG/ML IJ SOLN: 6.2500 mg | INTRAMUSCULAR | Status: DC | PRN

## 2011-07-09 MED ORDER — IBUPROFEN 200 MG PO TABS: 200.0000 mg | ORAL_TABLET | Freq: Four times a day (QID) | ORAL | Status: DC | PRN

## 2011-07-09 MED ORDER — ONDANSETRON HCL 4 MG/2ML IJ SOLN
INTRAMUSCULAR | Status: AC
  Filled 2011-07-09: qty 2

## 2011-07-09 MED ORDER — ONDANSETRON HCL 4 MG/2ML IJ SOLN
INTRAMUSCULAR | Status: DC | PRN
  Administered 2011-07-09: 4 mg via INTRAVENOUS

## 2011-07-09 MED ORDER — GLYCINE 1.5 % IR SOLN
Status: DC | PRN
  Administered 2011-07-09: 3000 mL

## 2011-07-09 MED ORDER — EPHEDRINE SULFATE 50 MG/ML IJ SOLN
INTRAMUSCULAR | Status: DC | PRN
  Administered 2011-07-09: 10 mg via INTRAVENOUS
  Administered 2011-07-09 (×2): 5 mg via INTRAVENOUS

## 2011-07-09 MED ORDER — CEFAZOLIN SODIUM 1-5 GM-% IV SOLN
INTRAVENOUS | Status: DC | PRN
  Administered 2011-07-09: 1 g via INTRAVENOUS

## 2011-07-09 SURGICAL SUPPLY — 30 items
APL SKNCLS STERI-STRIP NONHPOA (GAUZE/BANDAGES/DRESSINGS) ×2
BENZOIN TINCTURE PRP APPL 2/3 (GAUZE/BANDAGES/DRESSINGS) ×1 IMPLANT
BOOTIES KNEE HIGH SLOAN (MISCELLANEOUS) ×6 IMPLANT
CANISTER SUCTION 2500CC (MISCELLANEOUS) ×3 IMPLANT
CATH ROBINSON RED A/P 16FR (CATHETERS) ×3 IMPLANT
CLOSURE STERI STRIP 1/2 X4 (GAUZE/BANDAGES/DRESSINGS) ×1 IMPLANT
CLOTH BEACON ORANGE TIMEOUT ST (SAFETY) ×3 IMPLANT
CONTAINER PREFILL 10% NBF 60ML (FORM) ×5 IMPLANT
CORD ACTIVE DISPOSABLE (ELECTRODE) ×1
CORD ELECTRO ACTIVE DISP (ELECTRODE) ×2 IMPLANT
DILATOR CANAL MILEX (MISCELLANEOUS) ×1 IMPLANT
DRAPE UTILITY XL STRL (DRAPES) ×6 IMPLANT
ELECT CAUTERY BLADE 6.4 (BLADE) ×1 IMPLANT
ELECT LOOP GYNE PRO 24FR (CUTTING LOOP)
ELECT REM PT RETURN 9FT ADLT (ELECTROSURGICAL) ×3
ELECT VAPORTRODE GRVD BAR (ELECTRODE) IMPLANT
ELECTRODE LOOP GYNE PRO 24FR (CUTTING LOOP) IMPLANT
ELECTRODE REM PT RTRN 9FT ADLT (ELECTROSURGICAL) ×2 IMPLANT
ELECTRODE ROLLER VERSAPOINT (ELECTRODE) IMPLANT
ELECTRODE RT ANGLE VERSAPOINT (CUTTING LOOP) IMPLANT
ELECTRODE TWIZZLE TIP (MISCELLANEOUS) IMPLANT
GLOVE SURG SS PI 6.5 STRL IVOR (GLOVE) ×6 IMPLANT
GOWN PREVENTION PLUS LG XLONG (DISPOSABLE) ×6 IMPLANT
LOOP ANGLED CUTTING 22FR (CUTTING LOOP) IMPLANT
PACK HYSTEROSCOPY LF (CUSTOM PROCEDURE TRAY) ×3 IMPLANT
PIPET BIOPSY ENDOMETRIAL 3MM (SUCTIONS) ×1 IMPLANT
SPONGE GAUZE 4X4 12PLY (GAUZE/BANDAGES/DRESSINGS) ×2 IMPLANT
TAPE CLOTH SURG 4X10 WHT LF (GAUZE/BANDAGES/DRESSINGS) ×2 IMPLANT
TOWEL OR 17X24 6PK STRL BLUE (TOWEL DISPOSABLE) ×6 IMPLANT
WATER STERILE IRR 1000ML POUR (IV SOLUTION) ×3 IMPLANT

## 2011-07-09 NOTE — H&P (Signed)
  Additions to H&P:  No fever, chills, or vaginal bleeding.  No change in exam                               Pt underwent preoperative clearance evaluation with cardiology.

## 2011-07-09 NOTE — Transfer of Care (Signed)
Immediate Anesthesia Transfer of Care Note  Patient: Holly Patterson  Procedure(s) Performed:  DILATATION & CURETTAGE/HYSTEROSCOPY WITH RESECTOSCOPE - Dr. Pennie Rushing on room at 1108. Start at 1110.; BREAST BIOPSY - preop diagnosis abnormal calcifications of right breast.  Patient Location: PACU  Anesthesia Type: General  Level of Consciousness: awake, alert  and sedated  Airway & Oxygen Therapy: Patient Spontanous Breathing and Patient connected to face mask  Post-op Assessment: Report given to PACU RN and Post -op Vital signs reviewed and stable  Post vital signs: Reviewed and stable  Complications: No apparent anesthesia complications

## 2011-07-09 NOTE — H&P (Signed)
NAME:  Holly Patterson, Holly Patterson                ACCOUNT NO.:  MEDICAL RECORD NO.:  [id number]  LOCATION:                                 FACILITY:  PHYSICIAN:  Hal Morales, M.D.DATE OF BIRTH:  06-26-1921  DATE OF ADMISSION: DATE OF DISCHARGE:                             HISTORY & PHYSICAL   HISTORY OF PRESENT ILLNESS:  The patient is an 77 year old black widowed female para 3-0-0-3 who presents for evaluation of intrauterine fluid found on ultrasound in workup of weight loss.  The patient was seen in January 2012 secondary to a concern by her family that she had begun to lose significant amount of weight.  She has had weight that has been stable within 5 pounds for more than 20 years during her tenure as my patient.  The weight at the time of her presentation was 124 pounds approximately 10 pounds lower than her highest weight for me.  The patient had undergone a normal Pap smear during her annual evaluation in April 2011 and has had normal Pap smears since her 1991 conization of the cervix.  She had a history of breast cancer diagnosed in 2004 and treated with lumpectomy with normal mammogram since that time.  A pelvic ultrasound was done to further workup any GYN possibilities of weight loss.  The ovaries were normal bilaterally by ultrasound.  There was no free fluid in the cul-de-sac.  The endometrial cavity, however, was filled with fluid with the endometrial thickness measuring 1.8 mm anteriorly and 1.8 mm posteriorly.  It was thought to be an endometrial mass measuring approximately 5.5 x 3.4 mm.  Plans were made for hysteroscopy to evaluate the questionable endometrial lesion.  Since that time, the patient has had workup leading up to the hysteroscopy.  Also since that time, the patient has had a repeat mammogram showing an area of abnormal calcification.  It was thought that this would be a difficult core biopsy and the patient has decided to have excision of the  area.  PAST MEDICAL HISTORY: 1. Hypertension. 2. Hyperthyroidism. 3. Breast cancer.  PAST SURGICAL HISTORY:  Conization of the cervix in 1991, right breast cancer lumpectomy in 2004, appendectomy in 2011, and cataract extraction, bilateral in 2010.  OBSTETRICAL HISTORY:  The patient had 3 normal spontaneous vaginal deliveries.  FAMILY HISTORY:  Positive for prostate cancer in her father, nephrotic syndrome in her sister, breast cancer in her sister and mother, and pancreatic cancer in the sister.  SOCIAL HISTORY:  The patient is a widow.  She is a retired Programmer, systems. She has never been a smoker.  She has occasional wine with dinner once or twice a month.  CURRENT MEDICATIONS: 1. Alendronate 10 mg p.o. daily. 2. Levothyroxine 100 mcg p.o. daily. 3. Olmesartan 20 mg p.o. daily. 4. Calcium carbonate and vitamin D 2 tablets 2 times daily.  DRUG ALLERGIES:  No known allergies.  REVIEW OF SYSTEMS:  Positive only for some difficulties with memory. Her daughters state that she has had her evaluated at the [facility] and she is thought to have no significant element of dementia, but normal aging memory loss.  PHYSICAL EXAMINATION:  GENERAL:  The patient  is a thin black female in no acute distress who is cooperative and appeared younger than her reported age. VITAL SIGNS:  The blood pressure is 108/50, temperature 98.2, pulse 60, respirations 20, weight 123.5 pounds up from a nadir of 117 on May 05, 2011. NECK:  Thyroid is not enlarged. LUNGS:  Clear. HEART:  Regular rate and rhythm. ABDOMEN:  Soft without masses or organomegaly. PELVIC:  There is hypopigmentation bilaterally.  The vagina is atrophic. The cervix is stenotic.  The uterus is normal size, shape, consistency, anterior, mobile, and nontender.  Adnexa, no masses.  Rectovaginal, no masses.  IMPRESSION: 1. Questionable endometrial mass and fluid on ultrasound in the     endometrium. 2. Abnormal  breast calcifications in a patient with a history of     breast cancer.  DISPOSITION:  Recommendation has been made that the patient undergo hysteroscopy and possible endometrial biopsy with D and C.  The risks of anesthesia, bleeding, infection, damage to adjacent organs, and uterine perforation are explained to the patient and her daughter.  They seem to understand and wish to proceed.  This will be performed at [facility] on July 09, 2011.  It will be followed by excision of the aforementioned calcification.     Hal Morales, M.D.     VPH/MEDQ  D:  07/08/2011  T:  07/09/2011  Job:  [id number]

## 2011-07-09 NOTE — OR Nursing (Signed)
Breast biopsy specimen to Dr. Cherlyn Labella office via courier @ 1048.

## 2011-07-09 NOTE — Anesthesia Preprocedure Evaluation (Addendum)
Anesthesia Evaluation  Name, MR# and DOB Patient awake  General Assessment Comment  Reviewed: Allergy & Precautions, H&P , NPO status , Patient's Chart, lab work & pertinent test results  Airway Mallampati: II      Dental No notable dental hx. (+) Teeth Intact   Pulmonary    pulmonary exam normalPulmonary Exam Normal     Cardiovascular     Neuro/Psych Negative Neurological ROS    GI/Hepatic/Renal negative GI ROS, negative Liver ROS, and negative Renal ROS (+)       Endo/Other  Negative Endocrine ROS (+)      Abdominal Normal abdominal exam  (+)   Musculoskeletal negative musculoskeletal ROS (+)   Hematology negative hematology ROS (+)   Peds  Reproductive/Obstetrics negative OB ROS    Anesthesia Other Findings             Anesthesia Physical Anesthesia Plan  ASA: II  Anesthesia Plan: General   Post-op Pain Management:    Induction:   Airway Management Planned: LMA  Additional Equipment:   Intra-op Plan:   Post-operative Plan:   Informed Consent:   Plan Discussed with: CRNA  Anesthesia Plan Comments:        Anesthesia Quick Evaluation

## 2011-07-09 NOTE — Anesthesia Postprocedure Evaluation (Signed)
Anesthesia Post Note  Patient: Holly Patterson  Procedure(s) Performed:  DILATATION & CURETTAGE/HYSTEROSCOPY WITH RESECTOSCOPE - Dr. Pennie Rushing on room at 1108. Start at 1110.; BREAST BIOPSY - preop diagnosis abnormal calcifications of right breast.  Anesthesia type: General  Patient location: PACU  Post pain: Pain level controlled  Post assessment: Post-op Vital signs reviewed  Last Vitals:  Filed Vitals:   07/09/11 1224  BP:   Pulse: 55  Temp:   Resp: 14    Post vital signs: Reviewed  Level of consciousness: sedated  Complications: No apparent anesthesia complicationsfj

## 2011-07-09 NOTE — Op Note (Signed)
[name], [name]           ACCOUNT NO.:  [id number]  MEDICAL RECORD NO.:  [id number]  LOCATION:  [facility]                          FACILITY:  [facility]  PHYSICIAN:  Currie Paris, M.D.DATE OF BIRTH:  1921/10/25  DATE OF PROCEDURE:  07/09/2011 DATE OF DISCHARGE:  07/09/2011                              OPERATIVE REPORT   PREOPERATIVE DIAGNOSIS:  Abnormal calcifications in prior lumpectomy site for breast cancer.  POSTOPERATIVE DIAGNOSIS:  Abnormal calcifications in prior lumpectomy site for breast cancer.  PROCEDURE:  Needle guided excision, left breast calcifications.  SURGEON:  Currie Paris, MD  ANESTHESIA:  General.  CLINICAL HISTORY:  This lady has had a prior lumpectomy and radiation therapy for a breast cancer and has calcifications in the area.  They were somewhat suspicious and an excisional biopsy was recommended.  DESCRIPTION OF PROCEDURE:  The patient was seen in the holding area and she had no further questions.  We confirmed the plans as noted.  The guidewire films were reviewed.  The right breast was initialed.  The patient was taken to the operating room after satisfactory general anesthesia had been obtained, the breast was prepped and draped as a sterile field.  The patient was also undergoing vaginal surgery, and that area was prepped as well.  The time-out was done.  The guidewire entered at the very lateral edge of the curvilinear incision from her prior lumpectomy site was used at old scar.  There was some lytic tissue present which I thought was radiation changes.  I excised a cylinder of tissue around the guidewire which actually tracked fairly superficial.  Specific mammogram showed that the guidewire and some calcifications were in the specimen.  I spent several minutes irrigating, making sure everything was dry.  I took a little extra tissue inferiorly where the tissue was woody.  I got down to the chest wall.  Bleeders were either  sutured or cauterized.  I put 20 mL of 0.25% plain Marcaine to help with postop analgesia. Incision was closed with some 3-0 Vicryl and 4-0 Monocryl subcuticular plus Steri-Strips.  I did have to leave the cavity because I could not close any of the breast tissue as it was very woody and resistant and not mobile to close the cavity.     Currie Paris, M.D.     CJS/MEDQ  D:  07/09/2011  T:  07/09/2011  Job:  [id number]  cc:   Dr. Pennie Rushing

## 2011-07-09 NOTE — Op Note (Signed)
07/09/2011  11:44 AM  PATIENT:  Holly Patterson  77 y.o. female  PRE-OPERATIVE DIAGNOSIS:  intrauterine fluid collection and  cervical stenosis. Abnormal Calcifications area of Prior Breast Cancer Abnormal calcifications of right breast.  POST-OPERATIVE DIAGNOSIS:  Intrauterine Fluid Collection and Cervical Stenosis. Abnormal Calcifications Areas of Prior Breast Cancercalcifications of right breast.  PROCEDURE:  Procedure(s): DILATATION & CURETTAGE/HYSTEROSCOPY WITH RESECTOSCOPE BREAST BIOPSY  SURGEON:  Hal Morales, MD Dr. Cicero Duck  ASSISTANTS: n/a  ANESTHESIA:   local and general  ESTIMATED BLOOD LOSS: minimal <50cc   BLOOD ADMINISTERED:none  COMPLICATIONS:Uterine perforation at hysteroscopy  FINDINGS: Markedly stenotic cervix. Sounded 8 cm.  No visualization at hysteroscopy because of probable uterine perforation  FLUID DEFICIT:600cc  LOCAL MEDICATIONS USED:  LIDOCAINE 10CC  SPECIMEN:  Source of Specimen:  endometrium- minimal tissue  DISPOSITION OF SPECIMEN:  PATHOLOGY  COUNTS:  YES  DESCRIPTION OF PROCEDURE:the patient was taken to the operating room after appropriate identification and placed on the operating table. After the attainment of adequate general anesthesia she was placed in the modified lithotomy position.After completion of the breast biopsy by Dr. Jamey Ripa,  the perineum and vagina were prepped with multiple layers of Betadine. The bladder was emptied with a an in and out catheter. The perineum was draped in sterile field. A Graves speculum was placed in the vagina. The cervix was grasped with a single-tooth tenaculum. A paracervical block was achieved with a total of 10 cc of 2% Xylocaine and the 5 and 7:00 positions. The cervix was markedly stenotic, requiring the use of lacrimal probes initiate cervical dilation.  After dilation allowing accomodation of the Pipelle aspiration currette, minimal fluid was obtained on aspiration, though the uterus  was sounded to 8 cm.  Further dilation of the cervix to accomodate the diagnostic hysteroscope was attempted. The dilator was noted to advance in a manner to suggest perforation.  The hysteroscope was inserted, but no dilation of the cavity could be achieved, and the fluid deficit rapidly increased to 600cc.  The procedure was terminated after gentle currettage revealed no tissue.  All instruments were then removed from the vagina and the patient was awakened from general anesthesia and taken to the recovery room in satisfactory condition having tolerated the procedure well sponge and instrument counts correct.  PLAN OF CARE: PACU observation for stability of vital signs after suspected uterine perforation  PATIENT DISPOSITION:  PACU - hemodynamically stable.   Delay start of Pharmacological VTE agent (>24hrs) due to surgical blood loss or risk of bleeding:  not applicable   HAYGOOD,VANESSA P, MD 11:44 AM

## 2011-07-13 NOTE — Addendum Note (Signed)
Addendum  created 07/13/11 0521 by Sandrea Hughs.   Modules edited:Charting, Inpatient Notes

## 2011-07-14 ENCOUNTER — Telehealth (INDEPENDENT_AMBULATORY_CARE_PROVIDER_SITE_OTHER): Payer: Self-pay | Admitting: Surgery

## 2011-07-14 DIAGNOSIS — R921 Mammographic calcification found on diagnostic imaging of breast: Secondary | ICD-10-CM

## 2011-07-14 NOTE — Telephone Encounter (Signed)
I spoke with the patient's daughter today and gave her the pathology report. She has recurrent invasive ductal carcinoma. I told her that most likely we are going to need to proceed to a mastectomy. We will be discussing her at our breast cancer clinical conference next week before her appointment here. When she comes in for her visit we can discuss treatment options.  The daughter will speak with the patient directly in give her this information.

## 2011-07-15 ENCOUNTER — Telehealth (INDEPENDENT_AMBULATORY_CARE_PROVIDER_SITE_OTHER): Payer: Self-pay | Admitting: General Surgery

## 2011-07-15 NOTE — Telephone Encounter (Signed)
Gave her the path result and told her we would be discussing in cancer conference next week, but likely will need to do a mastectomy

## 2011-07-16 ENCOUNTER — Telehealth (INDEPENDENT_AMBULATORY_CARE_PROVIDER_SITE_OTHER): Payer: Self-pay | Admitting: General Surgery

## 2011-07-16 NOTE — Telephone Encounter (Signed)
I called patient's son re: breast cancer conference. He was enquiring to make sure his mother would be presented next Wednesday. I called pathology to verify and made him aware she is on the list. They will come to the conference next Wednesday and call with any problems.

## 2011-07-20 ENCOUNTER — Telehealth (INDEPENDENT_AMBULATORY_CARE_PROVIDER_SITE_OTHER): Payer: Self-pay | Admitting: General Surgery

## 2011-07-20 NOTE — Telephone Encounter (Signed)
I received breast cancer conference list today and patient was not on it. I spoke to [place] and she added patient to the list. Will call with any questions.

## 2011-07-24 ENCOUNTER — Encounter (INDEPENDENT_AMBULATORY_CARE_PROVIDER_SITE_OTHER): Payer: Self-pay | Admitting: Surgery

## 2011-07-24 ENCOUNTER — Ambulatory Visit (INDEPENDENT_AMBULATORY_CARE_PROVIDER_SITE_OTHER): Payer: Medicare Other | Admitting: Surgery

## 2011-07-24 VITALS — BP 124/60 | HR 68

## 2011-07-24 DIAGNOSIS — Z9889 Other specified postprocedural states: Secondary | ICD-10-CM

## 2011-07-24 NOTE — Progress Notes (Signed)
Holly Patterson DOB: June 25, 1921  CC: Postop visit after one to  HPI: This patient comes in for post op follow-up. Two she underwent  Excision of calcifications right upper outer quadrant at site of prior cancer on 07/09/11. She feels that she is doing well. She is taken basically no medications for pain since surgery.  PE: General: The patient appears to be healthy, NAD  The incision is healing nicely. I suspect there is a little bit of fluid present. It is no evidence of infection  IMPRESSION: The patient is doing well S/P noontime excision of suspicious calcification.  DATA REVIWED: Pathology report has been discussed with the patient. Shows invasive carcinoma. She was discussed at the multidisciplinary cancer conference this past week and recommendation for mastectomy was made. He is not yet seen her oncologist for followup. Patient is actually present along with her son, and OB/GYN, for that discussion.  PLAN: I think she will need a mastectomy. Other therapy may depend on final pathological results. She and her daughter and would like to have a consultation at [facility]. We will try to arrange this with Dr. Dossie Der. Once satisfactory can make further plans here.

## 2011-07-24 NOTE — Patient Instructions (Signed)
We will try to make arranges for you to be seen in the breast cancer clinic at [facility] when she had been evaluated here, please call us and let us know what the next step will be.  If you have any questions or concerns about your incision please give [place] a call

## 2011-07-28 ENCOUNTER — Encounter (INDEPENDENT_AMBULATORY_CARE_PROVIDER_SITE_OTHER): Payer: Medicare Other | Admitting: Surgery

## 2011-08-03 ENCOUNTER — Encounter (HOSPITAL_COMMUNITY): Payer: Self-pay | Admitting: Obstetrics and Gynecology

## 2011-08-03 ENCOUNTER — Telehealth (INDEPENDENT_AMBULATORY_CARE_PROVIDER_SITE_OTHER): Payer: Self-pay

## 2011-08-03 NOTE — Telephone Encounter (Signed)
Silvio Pate states Dr Jamey Ripa referred the pt there to [facility] and she requests notes, labs, path, mgm and treatment notes to be faxed to [phone]

## 2011-08-05 NOTE — Telephone Encounter (Signed)
Visit notes and pathology routed to [facility] at fax number provided.

## 2011-08-06 ENCOUNTER — Telehealth (INDEPENDENT_AMBULATORY_CARE_PROVIDER_SITE_OTHER): Payer: Self-pay | Admitting: General Surgery

## 2011-08-06 NOTE — Telephone Encounter (Signed)
Dr.Buchanan called stating pt did not want to go through with simple mastectomy surgery schd on 08-13-11..he wanted Jade to make sure that the patients surgery and pre-op were cancelled, along with faxing all recs to [facility] oncology where the patient would now like to go.Marland Kitchen

## 2011-08-06 NOTE — Telephone Encounter (Signed)
I will take her orders to the schedulers in our office and make them aware that the family would like this surgery cancelled. All notes were sent to [facility] two days ago.

## 2011-08-10 ENCOUNTER — Other Ambulatory Visit (HOSPITAL_COMMUNITY): Payer: Medicare Other

## 2011-08-13 ENCOUNTER — Inpatient Hospital Stay (HOSPITAL_COMMUNITY): Admission: RE | Admit: 2011-08-13 | Payer: Medicare Other | Source: Ambulatory Visit | Admitting: Surgery

## 2011-10-06 ENCOUNTER — Other Ambulatory Visit: Payer: Self-pay | Admitting: Ophthalmology

## 2011-10-06 NOTE — H&P (Signed)
  Pre-operative History and Physical for Ophthalmic Surgery  Zyana D Cauthon 10/05/2011                  Chief Complaint:Decreased vision with left eye, out of focus with right eye. Diagnosis: Anisometropia  No Known Allergies    Planned Procedure: Pars Plana Vitrectomy 23g, AC IOL exchange,  Sutured PC IOL                                       There were no vitals filed for this visit.  Pulse: 80              Resp: 16       ROS:   Past Medical History  Diagnosis Date  . History of conization of cervix   . Cancer rt breast cancer 2004  . Hyperthyroidism     in 2001  . Hypertension     Past Surgical History  Procedure Date  . Appendectomy 2011  . Cataract extraction, bilateral 2010  . Breast lumpectomy 2005    right  . Breast biopsy 07/09/2011    Procedure: BREAST BIOPSY;  Surgeon: Christian J Streck, MD;  Location: [facility];  Service: General;  Laterality: Right;  preop diagnosis abnormal calcifications of right breast.     History   Social History  . Marital Status: Widowed    Spouse Name: N/A    Number of Children: N/A  . Years of Education: N/A   Occupational History  . Not on file.   Social History Main Topics  . Smoking status: Never Smoker   . Smokeless tobacco: Not on file  . Alcohol Use: Yes     "wine once or twice a month"  . Drug Use: No  . Sexually Active: No   Other Topics Concern  . Not on file   Social History Narrative  . No narrative on file     The following examination is for anesthesia clearance for minimally invasive Ophthalmic surgery. It is primarily to document heart and lung findings and is not intended to elucidate unknown general medical conditions inclusive of abdominal masses, lung lesions, etc.   General Constitution: Within Normal Limits Alertness/Orientation:  Person, time place     yes   HEENT:  Eye Findings:                    left eye  Neck: supple without masses   Chest/Lungs: Clear to  auscultation  Cardiac: Normal S1 and S2 without Murmur, S3 or S4  Neuro: non-focal   Other pertinent findings: None  Impression: Anisometropia OS                       No contraindication for planned surgery.  Planned Procedure:  Pars Plana Vitrectomy, Retrieval of AC IOL, Secondary Sutured PC IOL OS    Mayank Teuscher, MD        

## 2011-10-09 ENCOUNTER — Encounter (HOSPITAL_COMMUNITY): Payer: Self-pay

## 2011-10-12 ENCOUNTER — Ambulatory Visit (HOSPITAL_COMMUNITY)
Admission: RE | Admit: 2011-10-12 | Discharge: 2011-10-12 | Disposition: A | Discharge status: Home / Self Care | Payer: Medicare Other | Source: Ambulatory Visit | Attending: Anesthesiology | Admitting: Anesthesiology

## 2011-10-12 ENCOUNTER — Encounter (HOSPITAL_COMMUNITY): Payer: Self-pay

## 2011-10-12 ENCOUNTER — Encounter (HOSPITAL_COMMUNITY)
Admission: RE | Admit: 2011-10-12 | Discharge: 2011-10-12 | Disposition: A | Discharge status: Home / Self Care | Payer: Medicare Other | Source: Ambulatory Visit | Attending: Ophthalmology | Admitting: Ophthalmology

## 2011-10-12 DIAGNOSIS — Z01812 Encounter for preprocedural laboratory examination: Secondary | ICD-10-CM | POA: Insufficient documentation

## 2011-10-12 DIAGNOSIS — Z01818 Encounter for other preprocedural examination: Secondary | ICD-10-CM | POA: Insufficient documentation

## 2011-10-12 HISTORY — DX: Hypothyroidism, unspecified: E03.9

## 2011-10-12 LAB — BASIC METABOLIC PANEL
CO2: 30 mEq/L (ref 19–32)
Calcium: 10 mg/dL (ref 8.4–10.5)
Creatinine, Ser: 1.36 mg/dL — ABNORMAL HIGH (ref 0.50–1.10)
Glucose, Bld: 89 mg/dL (ref 70–99)

## 2011-10-12 LAB — CBC
MCH: 23.7 pg — ABNORMAL LOW (ref 26.0–34.0)
MCV: 76.3 fL — ABNORMAL LOW (ref 78.0–100.0)
Platelets: 198 10*3/uL (ref 150–400)
RDW: 14.7 % (ref 11.5–15.5)
WBC: 9.3 10*3/uL (ref 4.0–10.5)

## 2011-10-12 LAB — SURGICAL PCR SCREEN: Staphylococcus aureus: POSITIVE — AB

## 2011-10-12 NOTE — Pre-Procedure Instructions (Signed)
20 Holly Patterson  10/12/2011   Your procedure is scheduled on:  10/21/2011  Report to [facility] at 0915 AM.  Call this number if you have problems the morning of surgery: [phone]   Remember:   Do not eat food:After Midnight.  Do not drink clear liquids: 4 Hours before arrival.  Take these medicines the morning of surgery with A SIP OF WATER: synthroid   Do not wear jewelry, make-up or nail polish.  Do not wear lotions, powders, or perfumes. You may wear deodorant.  Do not shave 48 hours prior to surgery.  Do not bring valuables to the hospital.  Contacts, dentures or bridgework may not be worn into surgery.  Leave suitcase in the car. After surgery it may be brought to your room.  For patients admitted to the hospital, checkout time is 11:00 AM the day of discharge.   Patients discharged the day of surgery will not be allowed to drive home.  Name and phone number of your driver: Francesca Jewett [phone]  Special Instructions: CHG Shower Use Special Wash: 1/2 bottle night before surgery and 1/2 bottle morning of surgery.   Please read over the following fact sheets that you were given: Pain Booklet, Coughing and Deep Breathing, MRSA Information and Surgical Site Infection Prevention

## 2011-10-20 MED ORDER — TETRACAINE HCL 0.5 % OP SOLN
2.0000 [drp] | OPHTHALMIC | Status: AC
  Administered 2011-10-21: 2 [drp] via OPHTHALMIC
  Filled 2011-10-20: qty 2

## 2011-10-20 MED ORDER — PREDNISOLONE ACETATE 1 % OP SUSP
1.0000 [drp] | OPHTHALMIC | Status: AC
  Administered 2011-10-21: 1 [drp] via OPHTHALMIC
  Filled 2011-10-20: qty 5

## 2011-10-20 MED ORDER — PHENYLEPHRINE HCL 2.5 % OP SOLN
1.0000 [drp] | OPHTHALMIC | Status: AC
  Administered 2011-10-21 (×3): 1 [drp] via OPHTHALMIC
  Filled 2011-10-20: qty 3

## 2011-10-20 MED ORDER — GATIFLOXACIN 0.5 % OP SOLN
1.0000 [drp] | OPHTHALMIC | Status: AC
  Administered 2011-10-21 (×3): 1 [drp] via OPHTHALMIC
  Filled 2011-10-20: qty 2.5

## 2011-10-20 MED ORDER — HOMATROPINE HBR 2 % OP SOLN
1.0000 [drp] | OPHTHALMIC | Status: AC
  Administered 2011-10-21 (×3): 1 [drp] via OPHTHALMIC
  Filled 2011-10-20: qty 5

## 2011-10-21 ENCOUNTER — Ambulatory Visit (HOSPITAL_COMMUNITY)
Admission: RE | Admit: 2011-10-21 | Discharge: 2011-10-21 | Disposition: A | Discharge status: Home / Self Care | Payer: Medicare Other | Source: Ambulatory Visit | Attending: Ophthalmology | Admitting: Ophthalmology

## 2011-10-21 ENCOUNTER — Encounter (HOSPITAL_COMMUNITY)
Admission: RE | Disposition: A | Discharge status: Home / Self Care | Payer: Self-pay | Source: Ambulatory Visit | Attending: Ophthalmology

## 2011-10-21 ENCOUNTER — Inpatient Hospital Stay (HOSPITAL_COMMUNITY): Payer: Medicare Other | Admitting: Anesthesiology

## 2011-10-21 ENCOUNTER — Encounter (HOSPITAL_COMMUNITY): Payer: Self-pay | Admitting: Anesthesiology

## 2011-10-21 DIAGNOSIS — Q1389 Other congenital malformations of anterior segment of eye: Secondary | ICD-10-CM | POA: Insufficient documentation

## 2011-10-21 DIAGNOSIS — E039 Hypothyroidism, unspecified: Secondary | ICD-10-CM | POA: Insufficient documentation

## 2011-10-21 DIAGNOSIS — H5231 Anisometropia: Secondary | ICD-10-CM | POA: Insufficient documentation

## 2011-10-21 DIAGNOSIS — K08109 Complete loss of teeth, unspecified cause, unspecified class: Secondary | ICD-10-CM | POA: Insufficient documentation

## 2011-10-21 DIAGNOSIS — Z853 Personal history of malignant neoplasm of breast: Secondary | ICD-10-CM | POA: Insufficient documentation

## 2011-10-21 DIAGNOSIS — Q132 Other congenital malformations of iris: Secondary | ICD-10-CM | POA: Insufficient documentation

## 2011-10-21 DIAGNOSIS — I10 Essential (primary) hypertension: Secondary | ICD-10-CM | POA: Insufficient documentation

## 2011-10-21 HISTORY — PX: PARS PLANA VITRECTOMY: SHX2166

## 2011-10-21 SURGERY — PARS PLANA VITRECTOMY WITH 23 GAUGE
Anesthesia: General | Laterality: Left | Wound class: Clean

## 2011-10-21 MED ORDER — TRAMADOL HCL 50 MG PO TABS: 50.0000 mg | ORAL_TABLET | ORAL | Status: DC | PRN

## 2011-10-21 MED ORDER — EPINEPHRINE HCL 1 MG/ML IJ SOLN
INTRAMUSCULAR | Status: DC | PRN
  Administered 2011-10-21: .3 mL

## 2011-10-21 MED ORDER — BSS IO SOLN
INTRAOCULAR | Status: DC | PRN
  Administered 2011-10-21: 15 mL via INTRAOCULAR

## 2011-10-21 MED ORDER — LIDOCAINE HCL (CARDIAC) 20 MG/ML IV SOLN
INTRAVENOUS | Status: DC | PRN
  Administered 2011-10-21: 30 mg via INTRAVENOUS

## 2011-10-21 MED ORDER — ONDANSETRON HCL 4 MG/2ML IJ SOLN
INTRAMUSCULAR | Status: DC | PRN
  Administered 2011-10-21: 4 mg via INTRAVENOUS

## 2011-10-21 MED ORDER — NA CHONDROIT SULF-NA HYALURON 40-30 MG/ML IO SOLN
INTRAOCULAR | Status: DC | PRN
  Administered 2011-10-21: 0.5 mL via INTRAOCULAR

## 2011-10-21 MED ORDER — BSS PLUS IO SOLN
INTRAOCULAR | Status: DC | PRN
  Administered 2011-10-21: 1 via INTRAOCULAR

## 2011-10-21 MED ORDER — HYPROMELLOSE (GONIOSCOPIC) 2.5 % OP SOLN
OPHTHALMIC | Status: DC | PRN
  Administered 2011-10-21: 15 [drp] via OPHTHALMIC

## 2011-10-21 MED ORDER — SODIUM CHLORIDE 0.9 % IV SOLN: INTRAVENOUS | Status: DC

## 2011-10-21 MED ORDER — ROCURONIUM BROMIDE 100 MG/10ML IV SOLN
INTRAVENOUS | Status: DC | PRN
  Administered 2011-10-21: 35 mg via INTRAVENOUS
  Administered 2011-10-21: 10 mg via INTRAVENOUS

## 2011-10-21 MED ORDER — PROMETHAZINE HCL 25 MG/ML IJ SOLN: 12.5000 mg | INTRAMUSCULAR | Status: DC | PRN

## 2011-10-21 MED ORDER — BUPIVACAINE HCL 0.75 % IJ SOLN
INTRAMUSCULAR | Status: DC | PRN
  Administered 2011-10-21: 30 mL

## 2011-10-21 MED ORDER — GLYCOPYRROLATE 0.2 MG/ML IJ SOLN
INTRAMUSCULAR | Status: DC | PRN
  Administered 2011-10-21: .5 mg via INTRAVENOUS

## 2011-10-21 MED ORDER — ONDANSETRON HCL 4 MG/2ML IJ SOLN: 4.0000 mg | Freq: Once | INTRAMUSCULAR | Status: DC | PRN

## 2011-10-21 MED ORDER — GATIFLOXACIN 0.5 % OP SOLN: 1.0000 [drp] | OPHTHALMIC | Status: DC

## 2011-10-21 MED ORDER — PHENYLEPHRINE HCL 2.5 % OP SOLN: 1.0000 [drp] | OPHTHALMIC | Status: DC

## 2011-10-21 MED ORDER — SODIUM CHLORIDE 0.9 % IV SOLN
INTRAVENOUS | Status: DC | PRN
  Administered 2011-10-21: 11:00:00 via INTRAVENOUS

## 2011-10-21 MED ORDER — PREDNISOLONE ACETATE 1 % OP SUSP: 1.0000 [drp] | OPHTHALMIC | Status: DC

## 2011-10-21 MED ORDER — BACITRACIN-POLYMYXIN B OP OINT
TOPICAL_OINTMENT | OPHTHALMIC | Status: DC | PRN
  Administered 2011-10-21: 1 via OPHTHALMIC

## 2011-10-21 MED ORDER — HOMATROPINE HBR 2 % OP SOLN: 1.0000 [drp] | OPHTHALMIC | Status: DC

## 2011-10-21 MED ORDER — DEXAMETHASONE SODIUM PHOSPHATE 10 MG/ML IJ SOLN
INTRAMUSCULAR | Status: DC | PRN
  Administered 2011-10-21: 10 mg via INTRAVENOUS

## 2011-10-21 MED ORDER — PROVISC 10 MG/ML IO SOLN
INTRAOCULAR | Status: DC | PRN
  Administered 2011-10-21: .85 mL via INTRAOCULAR

## 2011-10-21 MED ORDER — CEFAZOLIN SUBCONJUNCTIVAL INJECTION 100 MG/0.5 ML
200.0000 mg | INJECTION | Freq: Once | SUBCONJUNCTIVAL | Status: AC
  Administered 2011-10-21: 200 mg via SUBCONJUNCTIVAL
  Filled 2011-10-21: qty 1

## 2011-10-21 MED ORDER — EPHEDRINE SULFATE 50 MG/ML IJ SOLN
INTRAMUSCULAR | Status: DC | PRN
  Administered 2011-10-21: 10 mg via INTRAVENOUS

## 2011-10-21 MED ORDER — TETRACAINE HCL 0.5 % OP SOLN: 2.0000 [drp] | OPHTHALMIC | Status: DC

## 2011-10-21 MED ORDER — HYDROMORPHONE HCL PF 1 MG/ML IJ SOLN: 0.2500 mg | INTRAMUSCULAR | Status: DC | PRN

## 2011-10-21 MED ORDER — NEOSTIGMINE METHYLSULFATE 1 MG/ML IJ SOLN
INTRAMUSCULAR | Status: DC | PRN
  Administered 2011-10-21: 3 mg via INTRAVENOUS

## 2011-10-21 MED ORDER — PROPOFOL 10 MG/ML IV EMUL
INTRAVENOUS | Status: DC | PRN
  Administered 2011-10-21: 160 mg via INTRAVENOUS

## 2011-10-21 MED ORDER — FENTANYL CITRATE 0.05 MG/ML IJ SOLN
INTRAMUSCULAR | Status: DC | PRN
  Administered 2011-10-21: 100 ug via INTRAVENOUS
  Administered 2011-10-21: 25 ug via INTRAVENOUS
  Administered 2011-10-21: 50 ug via INTRAVENOUS

## 2011-10-21 SURGICAL SUPPLY — 74 items
APL SRG 3 HI ABS STRL LF PLS (MISCELLANEOUS) ×1
APPLICATOR COTTON TIP 6IN STRL (MISCELLANEOUS) ×3 IMPLANT
APPLICATOR DR MATTHEWS STRL (MISCELLANEOUS) ×1 IMPLANT
BLADE EYE MINI 60D BEAVER (BLADE) ×2 IMPLANT
BLADE KERATOME 2.75 (BLADE) IMPLANT
BLADE MVR KNIFE 19G (BLADE) ×2 IMPLANT
BLADE STAB KNIFE 15DEG (BLADE) ×2 IMPLANT
CANNULA ANT/CHMB 27G (MISCELLANEOUS) IMPLANT
CANNULA ANT/CHMB 27GA (MISCELLANEOUS) ×2 IMPLANT
CANNULA DUAL BORE 23G (CANNULA) IMPLANT
CANNULA FLEX TIP 23G (CANNULA) ×1 IMPLANT
CLOTH BEACON ORANGE TIMEOUT ST (SAFETY) ×2 IMPLANT
CORDS BIPOLAR (ELECTRODE) ×2 IMPLANT
DRAPE OPHTHALMIC 77X100 STRL (CUSTOM PROCEDURE TRAY) ×2 IMPLANT
DRAPE POUCH INSTRU U-SHP 10X18 (DRAPES) ×2 IMPLANT
DRSG TEGADERM 4X4.75 (GAUZE/BANDAGES/DRESSINGS) ×2 IMPLANT
EYE SHIELD UNIVERSAL CLEAR (GAUZE/BANDAGES/DRESSINGS) ×1 IMPLANT
FILTER BLUE MILLIPORE (MISCELLANEOUS) IMPLANT
GAS OPHTHALMIC (MISCELLANEOUS) IMPLANT
GLOVE BIO SURGEON STRL SZ7 (GLOVE) IMPLANT
GLOVE BIOGEL PI IND STRL 6.5 (GLOVE) IMPLANT
GLOVE BIOGEL PI INDICATOR 6.5 (GLOVE) ×1
GLOVE ECLIPSE 6.5 STRL STRAW (GLOVE) ×1 IMPLANT
GLOVE SS BIOGEL STRL SZ 6.5 (GLOVE) ×2 IMPLANT
GLOVE SUPERSENSE BIOGEL SZ 6.5 (GLOVE) ×2
GOWN STRL NON-REIN LRG LVL3 (GOWN DISPOSABLE) ×4 IMPLANT
ILLUMINATOR WIDEFIELD DIFF (MISCELLANEOUS) ×2 IMPLANT
KIT ROOM TURNOVER OR (KITS) ×2 IMPLANT
KNIFE CRESCENT 1.75 EDGEAHEAD (BLADE) ×2 IMPLANT
KNIFE GRIESHABER SHARP 2.5MM (MISCELLANEOUS) ×2 IMPLANT
LENS IOL POST 1PIECE DIOP 22.5 (Intraocular Lens) ×1 IMPLANT
MARKER SKIN DUAL TIP RULER LAB (MISCELLANEOUS) ×2 IMPLANT
NDL 18GX1X1/2 (RX/OR ONLY) (NEEDLE) ×1 IMPLANT
NDL 25GX 5/8IN NON SAFETY (NEEDLE) ×1 IMPLANT
NDL HYPO 30X.5 LL (NEEDLE) ×2 IMPLANT
NEEDLE 18GX1X1/2 (RX/OR ONLY) (NEEDLE) ×2 IMPLANT
NEEDLE 22X1 1/2 (OR ONLY) (NEEDLE) ×2 IMPLANT
NEEDLE 25GX 5/8IN NON SAFETY (NEEDLE) ×2 IMPLANT
NEEDLE HYPO 30X.5 LL (NEEDLE) ×4 IMPLANT
NS IRRIG 1000ML POUR BTL (IV SOLUTION) ×2 IMPLANT
PACK COMBINED CATERACT/VIT 23G (OPHTHALMIC RELATED) ×1 IMPLANT
PACK VITRECTOMY CUSTOM (CUSTOM PROCEDURE TRAY) ×1 IMPLANT
PACK VITRECTOMY PIC MCHSVP (PACKS) IMPLANT
PAD ARMBOARD 7.5X6 YLW CONV (MISCELLANEOUS) ×2 IMPLANT
PAD EYE OVAL STERILE LF (GAUZE/BANDAGES/DRESSINGS) ×1 IMPLANT
PAK VITRECTOMY PIK  23GA (OPHTHALMIC RELATED) ×2 IMPLANT
PHACO TIP KELMAN 45DEG (TIP) IMPLANT
PROBE DIRECTIONAL LASER (MISCELLANEOUS) ×2 IMPLANT
ROLLS DENTAL (MISCELLANEOUS) ×3 IMPLANT
SCRAPER DIAMOND DUST MEMBRANE (MISCELLANEOUS) IMPLANT
SET VGFI TUBING 8065808002 (SET/KITS/TRAYS/PACK) IMPLANT
SHUTTLE MONARCH TYPE A (NEEDLE) IMPLANT
SOLUTION ANTI FOG 6CC (MISCELLANEOUS) ×2 IMPLANT
SPEAR EYE SURG WECK-CEL (MISCELLANEOUS) ×6 IMPLANT
SUT ETHILON 10 0 CS140 6 (SUTURE) ×1 IMPLANT
SUT ETHILON 4 0 P 3 18 (SUTURE) ×2 IMPLANT
SUT ETHILON 5 0 P 3 18 (SUTURE)
SUT ETHILON 9 0 TG140 8 (SUTURE) ×2 IMPLANT
SUT NYLON 10.0 BLK (SUTURE) ×2 IMPLANT
SUT NYLON ETHILON 5-0 P-3 1X18 (SUTURE) IMPLANT
SUT PLAIN 6 0 TG1408 (SUTURE) ×2 IMPLANT
SUT POLY NON ABSORB 10-0 8 STR (SUTURE) ×2 IMPLANT
SUT VICRYL 7 0 TG140 8 (SUTURE) IMPLANT
SUT VICRYL ABS 6-0 S29 18IN (SUTURE) ×2 IMPLANT
SYR 20CC LL (SYRINGE) ×2 IMPLANT
SYR 50ML LL SCALE MARK (SYRINGE) ×2 IMPLANT
SYR 5ML LL (SYRINGE) IMPLANT
SYR TB 1ML LUER SLIP (SYRINGE) ×2 IMPLANT
SYRINGE 10CC LL (SYRINGE) IMPLANT
TAPE PAPER MEDFIX 1IN X 10YD (GAUZE/BANDAGES/DRESSINGS) ×1 IMPLANT
TOWEL OR 17X24 6PK STRL BLUE (TOWEL DISPOSABLE) ×6 IMPLANT
TUBE EXTENSION HAMMER (TUBING) ×1 IMPLANT
WATER STERILE IRR 1000ML POUR (IV SOLUTION) ×2 IMPLANT
WIPE INSTRUMENT VISIWIPE 73X73 (MISCELLANEOUS) ×1 IMPLANT

## 2011-10-21 NOTE — H&P (View-Only) (Signed)
  Pre-operative History and Physical for Ophthalmic Surgery  Holly Patterson 10/05/2011                  Chief Complaint:Decreased vision with left eye, out of focus with right eye. Diagnosis: Anisometropia  No Known Allergies    Planned Procedure: Pars Plana Vitrectomy 23g, AC IOL exchange,  Sutured PC IOL                                       There were no vitals filed for this visit.  Pulse: 80              Resp: 16       ROS:   Past Medical History  Diagnosis Date  . History of conization of cervix   . Cancer rt breast cancer 2004  . Hyperthyroidism     in 2001  . Hypertension     Past Surgical History  Procedure Date  . Appendectomy 2011  . Cataract extraction, bilateral 2010  . Breast lumpectomy 2005    right  . Breast biopsy 07/09/2011    Procedure: BREAST BIOPSY;  Surgeon: Currie Paris, MD;  Location: [facility];  Service: General;  Laterality: Right;  preop diagnosis abnormal calcifications of right breast.     History   Social History  . Marital Status: Widowed    Spouse Name: N/A    Number of Children: N/A  . Years of Education: N/A   Occupational History  . Not on file.   Social History Main Topics  . Smoking status: Never Smoker   . Smokeless tobacco: Not on file  . Alcohol Use: Yes     "wine once or twice a month"  . Drug Use: No  . Sexually Active: No   Other Topics Concern  . Not on file   Social History Narrative  . No narrative on file     The following examination is for anesthesia clearance for minimally invasive Ophthalmic surgery. It is primarily to document heart and lung findings and is not intended to elucidate unknown general medical conditions inclusive of abdominal masses, lung lesions, etc.   General Constitution: Within Normal Limits Alertness/Orientation:  Person, time place     yes   HEENT:  Eye Findings:                    left eye  Neck: supple without masses   Chest/Lungs: Clear to  auscultation  Cardiac: Normal S1 and S2 without Murmur, S3 or S4  Neuro: non-focal   Other pertinent findings: None  Impression: Anisometropia OS                       No contraindication for planned surgery.  Planned Procedure:  Pars Plana Vitrectomy, Retrieval of AC IOL, Secondary Sutured PC IOL OS    Shade Flood, MD

## 2011-10-21 NOTE — Transfer of Care (Signed)
Immediate Anesthesia Transfer of Care Note  Patient: Holly Patterson  Procedure(s) Performed:  PARS PLANA VITRECTOMY WITH 23 GAUGE - Intraocular Lens Exchange  Patient Location: PACU  Anesthesia Type: General  Level of Consciousness: awake, alert  and oriented  Airway & Oxygen Therapy: Patient Spontanous Breathing and Patient connected to nasal cannula oxygen  Post-op Assessment: Report given to PACU RN and Post -op Vital signs reviewed and stable  Post vital signs: Reviewed and stable  Complications: No apparent anesthesia complications

## 2011-10-21 NOTE — Anesthesia Postprocedure Evaluation (Signed)
  Anesthesia Post-op Note  Patient: Holly Patterson  Procedure(s) Performed:  PARS PLANA VITRECTOMY WITH 23 GAUGE - Intraocular Lens Exchange  Patient Location: PACU  Anesthesia Type: General  Level of Consciousness: awake, oriented, sedated and patient cooperative  Airway and Oxygen Therapy: Patient Spontanous Breathing and Patient connected to nasal cannula oxygen  Post-op Pain: mild  Post-op Assessment: Post-op Vital signs reviewed, Patient's Cardiovascular Status Stable, Respiratory Function Stable, Patent Airway, No signs of Nausea or vomiting and Pain level controlled  Post-op Vital Signs: stable  Complications: No apparent anesthesia complications

## 2011-10-21 NOTE — Brief Op Note (Signed)
10/21/2011  1:37 PM  PATIENT:  Holly Patterson  77 y.o. female  PRE-OPERATIVE DIAGNOSIS:  Anisometropia Left Eye  POST-OPERATIVE DIAGNOSIS:  Anisometropia Left Eye  PROCEDURE:  Procedure(s): PARS PLANA VITRECTOMY WITH 23 GAUGE  SURGEON:  Surgeon(s): [employer]  PHYSICIAN ASSISTANT:   ASSISTANTS: none   ANESTHESIA:   general  EBL:     BLOOD ADMINISTERED:none  DRAINS: none   LOCAL MEDICATIONS USED:  0.75% MARCAINE 4 CC  SPECIMEN:  No Specimen  DISPOSITION OF SPECIMEN:  N/A  COUNTS:  YES  TOURNIQUET:  * No tourniquets in log *  DICTATION: .Note written in EPIC  PLAN OF CARE: Discharge to home after PACU  PATIENT DISPOSITION:  PACU - hemodynamically stable.   Delay start of Pharmacological VTE agent (>24hrs) due to surgical blood loss or risk of bleeding:  not applicable

## 2011-10-21 NOTE — Interval H&P Note (Signed)
History and Physical Interval Note:   10/21/2011   10:01 AM   Holly Patterson  has presented today for surgery, with the diagnosis of Anisometropia  The various methods of treatment have been discussed with the patient and family. After consideration of risks, benefits and other options for treatment, the patient has consented to  Procedure(s): PARS PLANA VITRECTOMY WITH 23 GAUGE as a surgical intervention .  The patients' history has been reviewed, patient examined, no change in status, stable for surgery.  I have reviewed the patients' chart and labs.  Questions were answered to the patient's satisfaction.     Shade Flood  MD

## 2011-10-21 NOTE — Preoperative (Signed)
Beta Blockers   Reason not to administer Beta Blockers:Not Applicable 

## 2011-10-21 NOTE — Anesthesia Preprocedure Evaluation (Addendum)
Anesthesia Evaluation  Patient identified by MRN, date of birth, ID band Patient awake    Reviewed: Allergy & Precautions, H&P , NPO status , Patient's Chart, lab work & pertinent test results  History of Anesthesia Complications (+) AWARENESS UNDER ANESTHESIANegative for: history of anesthetic complications  Airway Mallampati: II TM Distance: >3 FB Neck ROM: Full    Dental  (+) Partial Lower, Teeth Intact and Dental Advisory Given   Pulmonary neg pulmonary ROS,    Pulmonary exam normal       Cardiovascular hypertension, Pt. on medications regular Normal    Neuro/Psych Negative Neurological ROS  Negative Psych ROS   GI/Hepatic negative GI ROS, Neg liver ROS,   Endo/Other  Negative Endocrine ROSHypothyroidism   Renal/GU negative Renal ROS  Genitourinary negative   Musculoskeletal   Abdominal   Peds negative pediatric ROS (+) Abnormal pediatric history - Hematology negative hematology ROS (+)   Anesthesia Other Findings   Reproductive/Obstetrics                        Anesthesia Physical Anesthesia Plan  ASA: II  Anesthesia Plan: General   Post-op Pain Management:    Induction: Intravenous  Airway Management Planned: Oral ETT  Additional Equipment:   Intra-op Plan:   Post-operative Plan:   Informed Consent: I have reviewed the patients History and Physical, chart, labs and discussed the procedure including the risks, benefits and alternatives for the proposed anesthesia with the patient or authorized representative who has indicated his/her understanding and acceptance.   Dental Advisory Given  Plan Discussed with: Anesthesiologist, CRNA and Surgeon  Anesthesia Plan Comments:        Anesthesia Quick Evaluation

## 2011-10-22 ENCOUNTER — Encounter (HOSPITAL_COMMUNITY): Payer: Self-pay | Admitting: Ophthalmology

## 2011-10-22 NOTE — Op Note (Signed)
Preslei D Corine Shelter 10/22/2011 Diagnosis: Anisometropia, Anterior Chamber Intraocular Lens, Corectopia  Procedure: Pars Plana Vitrectomy, Removal of Anterior Chamber Intraocular Lens, Sutured Posterior Chamber  Intraocular Lens, Iridoplasty Operative Eye:  left eye  Surgeon: Shade Flood Estimated Blood Loss: minimal Specimens for Pathology:  None Complications: none   The  patient was prepped and draped in the usual fashion for ocular surgery on the  left eye .  A solid lid speculum was placed. The conjunctiva was displaced with a cotton tipped applicator at the  4:30  meridian. A trocar/cannula was placed 3.5 mm from the surgical limbus. The cannula was visualized in the vitreous cavity. The infusion line was allowed to run and then clamped when placed at the cannula opening. The line was inserted and secured to the drape with an adhesive strip. Trocar/Cannulas were then placed at the 9:30 and 2:30 meridian. The light pipe and vitreous cutter were inserted into the vitreous cavity and the wide field lens was placed. Core vitrectomy was carried out uneventfully with care taken to remove the vitreous up to the vitreous base for 360 degrees.   The infusion line was clamped. A superior peripheral clear corneal groove was made with the [facility] 57 blade for 7mm centered at the 12:00 meridian.  A Keratome was then used to create a shelved incision into the anterior chamber for the full extent of the groove. The angled McPherson forcep was then usd to grasp the temporal haptic of the [facility] IOL and delicately deliver form the anterior chamber nagle into the incision and rotate it gradually out of the wound. The lens ws delivered uneventfully and was not attached to the angle by scar tissue.  A single 10-0 nylon suture was place to minimize excessive flow out of the eye from the infusion.  The 9:30 and 2:30 cannulas were removed with concomitant closure using a cotton tipped applicator. Conjunctival  peritomies were made at 1:00 to 3:00 and 11:00 to 9:00. The sclera was cleaned and cauterized. The cautery was used to mark the sclera for dissection of small chevron scleral flaps at 3:00 and 9:00. The [facility] 57 blade was used to make grooves for the flap in the sclera. A Lamellar dissection blade was then used to dissect the flaps. A 9-0 Prolene suture on a trans chamber McCannel type needle was the placed through the sclera at the inferior portion of the dissection blade horizontally across the eye posterior to the iris. A 26g needle ws used as a receiver which placed through the sclera at a similar location on the nasal aspect of the eye. Once the trans chamber needle was placed up to it's hilt into the sclera on the temporal side, the 26 gauge needle was used to withdraw it through the nasal side of the sclera with the suture bridging the posterior chamber.  The same maneuver was then completed a second time at the superior margin of the dissected scleral bed.   The Sinskey lens hook was used to deliver the superior 9-0 proline suture form the posterior chamber through the corneal incision external to the eye. The suture was severed and the tied to the eyelet on each respective haptic nasally and temporally with a quadruple knot. The same maneuver was then performed with the second 9-0 Prolene suture. The IOL was then manually placed through the anterior chamber into the anterior vitreous space. The attached prolene sutures were then gradually brought up to center the lens in the visual axis and to maneuver  the haptics into the sulcus. Once the lens was secured in the ideal location, the sutures were knotted beneath the scleral flap nasally and temporally.   The patient had a fixed dilated pupil which was large enough to shoe the supero-temporal edge of the centered IOL. I then performed iridoplasty through the temporal aspect of the cornea engaging the iris sphincter with the McCannel suture and exiting  the inferior cornea with the trans chamber needle. The suture superior and inferior to the pupil was retreived with the Sinskey lens hook and externalized. The suture was tied and used to bring the temporal edge of the dilated pupil together creating a smaller pupil. The suture was then knotted at the wound and cut, repossiting the sutured iris back into the anterior chamber. The pupil was now subjectively about 4-5 mm in diameter with no edge of the IOL showing.  The cornea wound was closed with interupted 10-0 nylon sutures. The scleral openings were sutured with 7-0 Vicryl well as the apex of each scleral flap. The conjunctival incisions were closed with 6-0 plain gut suture.     Subconjunctival injections of Ancef 100mg /0.79ml and Dexamethasone 4mg /33ml were placed through the peritomies with an olive tipped cannula. The infusion cannula was removed with concomitant tamponade with the cotton tipped applicator leaving the ocular pressure less than 20 by palpation.   The speculum and drapes were removed and the eye was patched with Polymixin/Bacitracin ophthalmic ointment. An eye shield was placed and the patient was transferred alert and conversant with stable vital signs to the post operative recovery area.  Shade Flood MD

## 2012-02-29 ENCOUNTER — Other Ambulatory Visit: Payer: Self-pay

## 2012-02-29 DIAGNOSIS — Z780 Asymptomatic menopausal state: Secondary | ICD-10-CM

## 2012-03-10 ENCOUNTER — Other Ambulatory Visit: Payer: Self-pay

## 2012-03-10 ENCOUNTER — Ambulatory Visit: Payer: Self-pay | Admitting: Obstetrics and Gynecology

## 2014-03-26 ENCOUNTER — Encounter (INDEPENDENT_AMBULATORY_CARE_PROVIDER_SITE_OTHER): Payer: Self-pay

## 2014-03-26 ENCOUNTER — Other Ambulatory Visit: Payer: Self-pay | Admitting: Cardiology

## 2014-03-26 ENCOUNTER — Ambulatory Visit
Admission: RE | Admit: 2014-03-26 | Discharge: 2014-03-26 | Disposition: A | Discharge status: Home / Self Care | Payer: Medicare Other | Source: Ambulatory Visit | Attending: Cardiology | Admitting: Cardiology

## 2014-03-26 DIAGNOSIS — R109 Unspecified abdominal pain: Secondary | ICD-10-CM

## 2014-03-29 ENCOUNTER — Emergency Department (HOSPITAL_COMMUNITY): Payer: Medicare Other

## 2014-03-29 ENCOUNTER — Emergency Department (HOSPITAL_COMMUNITY)
Admission: EM | Admit: 2014-03-29 | Discharge: 2014-03-30 | Disposition: A | Discharge status: Home / Self Care | Payer: Medicare Other | Attending: Emergency Medicine | Admitting: Emergency Medicine

## 2014-03-29 ENCOUNTER — Encounter (HOSPITAL_COMMUNITY): Payer: Self-pay | Admitting: Emergency Medicine

## 2014-03-29 DIAGNOSIS — Z8742 Personal history of other diseases of the female genital tract: Secondary | ICD-10-CM | POA: Insufficient documentation

## 2014-03-29 DIAGNOSIS — N23 Unspecified renal colic: Secondary | ICD-10-CM | POA: Insufficient documentation

## 2014-03-29 DIAGNOSIS — I10 Essential (primary) hypertension: Secondary | ICD-10-CM | POA: Insufficient documentation

## 2014-03-29 DIAGNOSIS — Z859 Personal history of malignant neoplasm, unspecified: Secondary | ICD-10-CM | POA: Insufficient documentation

## 2014-03-29 DIAGNOSIS — Z79899 Other long term (current) drug therapy: Secondary | ICD-10-CM | POA: Insufficient documentation

## 2014-03-29 DIAGNOSIS — K5900 Constipation, unspecified: Secondary | ICD-10-CM | POA: Insufficient documentation

## 2014-03-29 DIAGNOSIS — E039 Hypothyroidism, unspecified: Secondary | ICD-10-CM | POA: Insufficient documentation

## 2014-03-29 LAB — COMPREHENSIVE METABOLIC PANEL
ALBUMIN: 3.7 g/dL (ref 3.5–5.2)
ALK PHOS: 71 U/L (ref 39–117)
ALT: 20 U/L (ref 0–35)
AST: 29 U/L (ref 0–37)
BUN: 23 mg/dL (ref 6–23)
CHLORIDE: 101 meq/L (ref 96–112)
CO2: 26 mEq/L (ref 19–32)
CREATININE: 1.84 mg/dL — AB (ref 0.50–1.10)
Calcium: 10 mg/dL (ref 8.4–10.5)
GFR, EST AFRICAN AMERICAN: 27 mL/min — AB (ref >90)
GFR, EST NON AFRICAN AMERICAN: 23 mL/min — AB (ref >90)
Glucose, Bld: 115 mg/dL — ABNORMAL HIGH (ref 70–99)
POTASSIUM: 3.8 meq/L (ref 3.7–5.3)
Sodium: 142 mEq/L (ref 137–147)
Total Bilirubin: 0.7 mg/dL (ref 0.3–1.2)
Total Protein: 7.4 g/dL (ref 6.0–8.3)

## 2014-03-29 LAB — URINALYSIS, ROUTINE W REFLEX MICROSCOPIC
Bilirubin Urine: NEGATIVE
GLUCOSE, UA: NEGATIVE mg/dL
Ketones, ur: NEGATIVE mg/dL
Nitrite: NEGATIVE
PH: 6.5 (ref 5.0–8.0)
PROTEIN: 30 mg/dL — AB
SPECIFIC GRAVITY, URINE: 1.022 (ref 1.005–1.030)
Urobilinogen, UA: 1 mg/dL (ref 0.0–1.0)

## 2014-03-29 LAB — CBC WITH DIFFERENTIAL/PLATELET
BASOS PCT: 0 % (ref 0–1)
Basophils Absolute: 0 10*3/uL (ref 0.0–0.1)
EOS PCT: 1 % (ref 0–5)
Eosinophils Absolute: 0.1 10*3/uL (ref 0.0–0.7)
HCT: 32.6 % — ABNORMAL LOW (ref 36.0–46.0)
HEMOGLOBIN: 10.7 g/dL — AB (ref 12.0–15.0)
LYMPHS PCT: 13 % (ref 12–46)
Lymphs Abs: 1.2 10*3/uL (ref 0.7–4.0)
MCH: 24.6 pg — ABNORMAL LOW (ref 26.0–34.0)
MCHC: 32.8 g/dL (ref 30.0–36.0)
MCV: 74.9 fL — ABNORMAL LOW (ref 78.0–100.0)
MONOS PCT: 10 % (ref 3–12)
Monocytes Absolute: 1 10*3/uL (ref 0.1–1.0)
NEUTROS PCT: 76 % (ref 43–77)
Neutro Abs: 7.2 10*3/uL (ref 1.7–7.7)
Platelets: 241 10*3/uL (ref 150–400)
RBC: 4.35 MIL/uL (ref 3.87–5.11)
RDW: 14.6 % (ref 11.5–15.5)
WBC: 9.5 10*3/uL (ref 4.0–10.5)

## 2014-03-29 LAB — I-STAT CG4 LACTIC ACID, ED: LACTIC ACID, VENOUS: 1.53 mmol/L (ref 0.5–2.2)

## 2014-03-29 LAB — URINE MICROSCOPIC-ADD ON

## 2014-03-29 LAB — LIPASE, BLOOD: LIPASE: 25 U/L (ref 11–59)

## 2014-03-29 MED ORDER — IOHEXOL 300 MG/ML  SOLN
50.0000 mL | Freq: Once | INTRAMUSCULAR | Status: AC | PRN
  Administered 2014-03-29: 50 mL via ORAL

## 2014-03-29 MED ORDER — SODIUM CHLORIDE 0.9 % IV SOLN
INTRAVENOUS | Status: DC
  Administered 2014-03-29: 10 mL/h via INTRAVENOUS

## 2014-03-29 MED ORDER — DOCUSATE SODIUM 100 MG PO CAPS: 100.0000 mg | ORAL_CAPSULE | Freq: Two times a day (BID) | ORAL | Status: DC

## 2014-03-29 MED ORDER — TAMSULOSIN HCL 0.4 MG PO CAPS: 0.4000 mg | ORAL_CAPSULE | Freq: Every day | ORAL | Status: DC

## 2014-03-29 MED ORDER — HYDROCODONE-ACETAMINOPHEN 5-325 MG PO TABS
1.0000 | ORAL_TABLET | ORAL | Status: DC | PRN
Start: 2014-03-29 — End: 2015-11-27

## 2014-03-29 NOTE — ED Provider Notes (Signed)
CSN: [id number]     Arrival date & time 03/29/14  1939 History   First MD Initiated Contact with Patient 03/29/14 2119     Chief Complaint  Patient presents with  . Abdominal Pain     (Consider location/radiation/quality/duration/timing/severity/associated sxs/prior Treatment) Patient is a 80 y.o. female presenting with abdominal pain. The history is provided by the patient and a relative.  Abdominal Pain  Patient here complaining of abdominal pain since Monday. Pain is worse on left lower quadrant. Seen by her Dr. for similar symptoms and diagnosed with constipation. Pain continues at this time. No diarrhea. No fever or chills. Denies any hematuria dysuria. Pain characterized as cramping and has been persistent. Nothing makes her symptoms better worse. She has not used anything to make her constipation better. Past Medical History  Diagnosis Date  . History of conization of cervix   . Hypertension   . Hypothyroidism   . Cancer rt breast cancer 2004   Past Surgical History  Procedure Laterality Date  . Appendectomy  2011  . Cataract extraction, bilateral  2010    left eye  . Breast lumpectomy  2005    right  . Breast biopsy  07/09/2011    Procedure: BREAST BIOPSY;  Surgeon: Haywood Lasso, MD;  Location: [place] [facility];  Service: General;  Laterality: Right;  preop diagnosis abnormal calcifications of right breast.  . Pars plana vitrectomy  10/21/2011    Procedure: PARS PLANA VITRECTOMY WITH 23 GAUGE;  Surgeon: Adonis Brook;  Location: [place] [facility];  Service: Ophthalmology;  Laterality: Left;  Intraocular Lens Exchange   Family History  Problem Relation Age of Onset  . Prostate cancer Father   . Nephrotic syndrome Sister   . Breast cancer Sister   . Breast cancer Mother   . Pancreatic cancer Sister    History  Substance Use Topics  . Smoking status: Never Smoker   . Smokeless tobacco: Not on file  . Alcohol Use: No     Comment: "wine once or twice a month"   OB History   Grav Para  Term Preterm Abortions TAB SAB Ect Mult Living                 Review of Systems  Gastrointestinal: Positive for abdominal pain.  All other systems reviewed and are negative.     Allergies  Review of patient's allergies indicates no known allergies.  Home Medications   Prior to Admission medications   Medication Sig Start Date End Date Taking? Authorizing Provider  alendronate (FOSAMAX) 70 MG tablet Take 70 mg by mouth every 7 (seven) days. Take with a full glass of water on an empty stomach. Patient takes on Sunday.     Historical Provider, MD  calcium & magnesium carbonates (MYLANTA) 311-232 MG per tablet Take 1 tablet by mouth daily.     Historical Provider, MD  Calcium Carbonate-Vitamin D (CALCIUM-VITAMIN D) 500-200 MG-UNIT per tablet Take 1 tablet by mouth daily.     Historical Provider, MD  cholecalciferol (VITAMIN D) 400 UNITS TABS Take 400 Units by mouth daily.      Historical Provider, MD  EXELON 4.6 MG/24HR Place 1 patch onto the skin daily.  05/28/11   Historical Provider, MD  letrozole (FEMARA) 2.5 MG tablet Take 2.5 mg by mouth daily.      Historical Provider, MD  levothyroxine (SYNTHROID, LEVOTHROID) 50 MCG tablet Take 50 mcg by mouth daily before breakfast.      Historical Provider, MD  Multiple Vitamins-Minerals (  CENTRUM PO) Take 1 tablet by mouth daily.     Historical Provider, MD  olmesartan-hydrochlorothiazide (BENICAR HCT) 20-12.5 MG per tablet Take 1 tablet by mouth daily.      Historical Provider, MD   BP 189/71  Pulse 70  Temp(Src) 99.1 F (37.3 C) (Oral)  Resp 16  SpO2 99% Physical Exam  Nursing note and vitals reviewed. Constitutional: She is oriented to person, place, and time. She appears well-developed and well-nourished.  Non-toxic appearance. No distress.  HENT:  Head: Normocephalic and atraumatic.  Eyes: Conjunctivae, EOM and lids are normal. Pupils are equal, round, and reactive to light.  Neck: Normal range of motion. Neck supple. No tracheal  deviation present. No mass present.  Cardiovascular: Normal rate, regular rhythm and normal heart sounds.  Exam reveals no gallop.   No murmur heard. Pulmonary/Chest: Effort normal and breath sounds normal. No stridor. No respiratory distress. She has no decreased breath sounds. She has no wheezes. She has no rhonchi. She has no rales.  Abdominal: Soft. Normal appearance and bowel sounds are normal. She exhibits no distension. There is tenderness in the left lower quadrant. There is no rigidity, no rebound, no guarding and no CVA tenderness.  Musculoskeletal: Normal range of motion. She exhibits no edema and no tenderness.  Neurological: She is alert and oriented to person, place, and time. She has normal strength. No cranial nerve deficit or sensory deficit. GCS eye subscore is 4. GCS verbal subscore is 5. GCS motor subscore is 6.  Skin: Skin is warm and dry. No abrasion and no rash noted.  Psychiatric: She has a normal mood and affect. Her speech is normal and behavior is normal.    ED Course  Procedures (including critical care time) Labs Review Labs Reviewed  CBC WITH DIFFERENTIAL  URINALYSIS, ROUTINE W REFLEX MICROSCOPIC  COMPREHENSIVE METABOLIC PANEL  LIPASE, BLOOD  I-STAT CG4 LACTIC ACID, ED    Imaging Review Ct Abdomen Pelvis Wo Contrast  03/29/2014   CLINICAL DATA:  Abdominal pain  EXAM: CT ABDOMEN AND PELVIS WITHOUT CONTRAST  TECHNIQUE: Multidetector CT imaging of the abdomen and pelvis was performed following the standard protocol without IV contrast.  COMPARISON:  02/04/2010  FINDINGS: BODY WALL: Unremarkable.  LOWER CHEST: Unremarkable.  ABDOMEN/PELVIS:  Liver: No focal abnormality.  Biliary: No evidence of biliary obstruction or stone.  Pancreas: Unremarkable.  Spleen: Unremarkable.  Adrenals: Unremarkable.  Kidneys and ureters: Superimposed on bilateral renal sinus cysts, there is left-sided hydronephrosis related to a 4 mm stone in the upper left ureter. There are at least 2  left renal calculi, nonobstructive and measuring up to 2 mm in the lower pole.  Bladder: Unremarkable.  Reproductive: 12 mm calcified fibroid in the left uterine fundus.  Bowel: No obstruction. Appendectomy.  Retroperitoneum: No mass or adenopathy.  Peritoneum: Trace perihepatic and pelvic fluid which is nonspecific.  Vascular: No acute abnormality.  OSSEOUS: Advanced lumbar degenerative disc and facet disease. Osteopenia without fracture.  IMPRESSION: 1. 4 mm stone in the upper left ureter with mild hydronephrosis. 2. Left nonobstructive nephrolithiasis. 3. Trace ascites.   Electronically Signed   By: Jorje Guild M.D.   On: 03/29/2014 23:33     EKG Interpretation None      MDM   Final diagnoses:  None    Patient had a digital rectal exam which showed hard stool but no signs of fecal impaction. Abdominal CT results noted. Patient given enema by nursing. We will discharge patient home with a prescription for  Colace, pain meds, Flomax and give urology referral    Leota Jacobsen, MD 03/29/14 2356

## 2014-03-29 NOTE — Discharge Instructions (Signed)
Constipation, Adult Constipation is when a person has fewer than 3 bowel movements a week; has difficulty having a bowel movement; or has stools that are dry, hard, or larger than normal. As people grow older, constipation is more common. If you try to fix constipation with medicines that make you have a bowel movement (laxatives), the problem may get worse. Long-term laxative use may cause the muscles of the colon to become weak. A low-fiber diet, not taking in enough fluids, and taking certain medicines may make constipation worse. CAUSES   Certain medicines, such as antidepressants, pain medicine, iron supplements, antacids, and water pills.   Certain diseases, such as diabetes, irritable bowel syndrome (IBS), thyroid disease, or depression.   Not drinking enough water.   Not eating enough fiber-rich foods.   Stress or travel.  Lack of physical activity or exercise.  Not going to the restroom when there is the urge to have a bowel movement.  Ignoring the urge to have a bowel movement.  Using laxatives too much. SYMPTOMS   Having fewer than 3 bowel movements a week.   Straining to have a bowel movement.   Having hard, dry, or larger than normal stools.   Feeling full or bloated.   Pain in the lower abdomen.  Not feeling relief after having a bowel movement. DIAGNOSIS  Your caregiver will take a medical history and perform a physical exam. Further testing may be done for severe constipation. Some tests may include:   A barium enema X-ray to examine your rectum, colon, and sometimes, your small intestine.  A sigmoidoscopy to examine your lower colon.  A colonoscopy to examine your entire colon. TREATMENT  Treatment will depend on the severity of your constipation and what is causing it. Some dietary treatments include drinking more fluids and eating more fiber-rich foods. Lifestyle treatments may include regular exercise. If these diet and lifestyle recommendations  do not help, your caregiver may recommend taking over-the-counter laxative medicines to help you have bowel movements. Prescription medicines may be prescribed if over-the-counter medicines do not work.  HOME CARE INSTRUCTIONS   Increase dietary fiber in your diet, such as fruits, vegetables, whole grains, and beans. Limit high-fat and processed sugars in your diet, such as [place] fries, hamburgers, cookies, candies, and soda.   A fiber supplement may be added to your diet if you cannot get enough fiber from foods.   Drink enough fluids to keep your urine clear or pale yellow.   Exercise regularly or as directed by your caregiver.   Go to the restroom when you have the urge to go. Do not hold it.  Only take medicines as directed by your caregiver. Do not take other medicines for constipation without talking to your caregiver first. [place] IF:   You have bright red blood in your stool.   Your constipation lasts for more than 4 days or gets worse.   You have abdominal or rectal pain.   You have thin, pencil-like stools.  You have unexplained weight loss. MAKE SURE YOU:   Understand these instructions.  Will watch your condition.  Will get help right away if you are not doing well or get worse. Document Released: 08/07/2004 Document Revised: 02/01/2012 Document Reviewed: 08/21/2013 [facility] Patient Information 2014 [place], [place]. Kidney Stones Kidney stones (urolithiasis) are deposits that form inside your kidneys. The intense pain is caused by the stone moving through the urinary tract. When the stone moves, the ureter goes into spasm around the  stone. The stone is usually passed in the urine.  CAUSES   A disorder that makes certain neck glands produce too much parathyroid hormone (primary hyperparathyroidism).  A buildup of uric acid crystals, similar to gout in your joints.  Narrowing (stricture) of the ureter.  A kidney obstruction present at  birth (congenital obstruction).  Previous surgery on the kidney or ureters.  Numerous kidney infections. SYMPTOMS   Feeling sick to your stomach (nauseous).  Throwing up (vomiting).  Blood in the urine (hematuria).  Pain that usually spreads (radiates) to the groin.  Frequency or urgency of urination. DIAGNOSIS   Taking a history and physical exam.  Blood or urine tests.  CT scan.  Occasionally, an examination of the inside of the urinary bladder (cystoscopy) is performed. TREATMENT   Observation.  Increasing your fluid intake.  Extracorporeal shock wave lithotripsy This is a noninvasive procedure that uses shock waves to break up kidney stones.  Surgery may be needed if you have severe pain or persistent obstruction. There are various surgical procedures. Most of the procedures are performed with the use of small instruments. Only small incisions are needed to accommodate these instruments, so recovery time is minimized. The size, location, and chemical composition are all important variables that will determine the proper choice of action for you. Talk to your health care provider to better understand your situation so that you will minimize the risk of injury to yourself and your kidney.  HOME CARE INSTRUCTIONS   Drink enough water and fluids to keep your urine clear or pale yellow. This will help you to pass the stone or stone fragments.  Strain all urine through the provided strainer. Keep all particulate matter and stones for your health care provider to see. The stone causing the pain may be as small as a grain of salt. It is very important to use the strainer each and every time you pass your urine. The collection of your stone will allow your health care provider to analyze it and verify that a stone has actually passed. The stone analysis will often identify what you can do to reduce the incidence of recurrences.  Only take over-the-counter or prescription medicines  for pain, discomfort, or fever as directed by your health care provider.  Make a follow-up appointment with your health care provider as directed.  Get follow-up X-rays if required. The absence of pain does not always mean that the stone has passed. It may have only stopped moving. If the urine remains completely obstructed, it can cause loss of kidney function or even complete destruction of the kidney. It is your responsibility to make sure X-rays and follow-ups are completed. Ultrasounds of the kidney can show blockages and the status of the kidney. Ultrasounds are not associated with any radiation and can be performed easily in a matter of minutes. SEEK MEDICAL CARE IF:  You experience pain that is progressive and unresponsive to any pain medicine you have been prescribed. SEEK IMMEDIATE MEDICAL CARE IF:   Pain cannot be controlled with the prescribed medicine.  You have a fever or shaking chills.  The severity or intensity of pain increases over 18 hours and is not relieved by pain medicine.  You develop a new onset of abdominal pain.  You feel faint or pass out.  You are unable to urinate. MAKE SURE YOU:   Understand these instructions.  Will watch your condition.  Will get help right away if you are not doing well or get worse.  Document Released: 11/09/2005 Document Revised: 07/12/2013 Document Reviewed: 04/12/2013 [facility] Patient Information 2014 [place].

## 2014-03-29 NOTE — ED Notes (Signed)
Pt was seen here Monday for abd pain and hematuria, today shes had increased abd discomfort, xray on Monday showed large amounts of stool. Pt has had small bowel movements and continues to complain of left lower quad pain

## 2014-03-30 MED ORDER — FLEET ENEMA 7-19 GM/118ML RE ENEM
1.0000 | ENEMA | Freq: Once | RECTAL | Status: AC
  Administered 2014-03-30: 1 via RECTAL
  Filled 2014-03-30: qty 1

## 2015-10-13 ENCOUNTER — Emergency Department (HOSPITAL_COMMUNITY)
Admission: EM | Admit: 2015-10-13 | Discharge: 2015-10-13 | Disposition: A | Discharge status: Home / Self Care | Payer: Medicare Other | Attending: Emergency Medicine | Admitting: Emergency Medicine

## 2015-10-13 ENCOUNTER — Encounter (HOSPITAL_COMMUNITY): Payer: Self-pay | Admitting: Emergency Medicine

## 2015-10-13 ENCOUNTER — Emergency Department (HOSPITAL_COMMUNITY): Payer: Medicare Other

## 2015-10-13 DIAGNOSIS — E039 Hypothyroidism, unspecified: Secondary | ICD-10-CM | POA: Insufficient documentation

## 2015-10-13 DIAGNOSIS — Z79890 Hormone replacement therapy (postmenopausal): Secondary | ICD-10-CM | POA: Insufficient documentation

## 2015-10-13 DIAGNOSIS — R0602 Shortness of breath: Secondary | ICD-10-CM | POA: Diagnosis present

## 2015-10-13 DIAGNOSIS — J90 Pleural effusion, not elsewhere classified: Secondary | ICD-10-CM | POA: Insufficient documentation

## 2015-10-13 DIAGNOSIS — Z9889 Other specified postprocedural states: Secondary | ICD-10-CM | POA: Diagnosis not present

## 2015-10-13 DIAGNOSIS — C50911 Malignant neoplasm of unspecified site of right female breast: Secondary | ICD-10-CM

## 2015-10-13 DIAGNOSIS — I10 Essential (primary) hypertension: Secondary | ICD-10-CM | POA: Diagnosis not present

## 2015-10-13 DIAGNOSIS — Z79899 Other long term (current) drug therapy: Secondary | ICD-10-CM | POA: Insufficient documentation

## 2015-10-13 LAB — URINALYSIS, ROUTINE W REFLEX MICROSCOPIC
BILIRUBIN URINE: NEGATIVE
Glucose, UA: NEGATIVE mg/dL
HGB URINE DIPSTICK: NEGATIVE
Ketones, ur: NEGATIVE mg/dL
Leukocytes, UA: NEGATIVE
NITRITE: NEGATIVE
PROTEIN: NEGATIVE mg/dL
SPECIFIC GRAVITY, URINE: 1.023 (ref 1.005–1.030)
pH: 5 (ref 5.0–8.0)

## 2015-10-13 LAB — COMPREHENSIVE METABOLIC PANEL
ALBUMIN: 3.1 g/dL — AB (ref 3.5–5.0)
ALT: 21 U/L (ref 14–54)
ANION GAP: 8 (ref 5–15)
AST: 27 U/L (ref 15–41)
Alkaline Phosphatase: 46 U/L (ref 38–126)
BILIRUBIN TOTAL: 0.4 mg/dL (ref 0.3–1.2)
BUN: 20 mg/dL (ref 6–20)
CO2: 23 mmol/L (ref 22–32)
Calcium: 8.9 mg/dL (ref 8.9–10.3)
Chloride: 112 mmol/L — ABNORMAL HIGH (ref 101–111)
Creatinine, Ser: 1.54 mg/dL — ABNORMAL HIGH (ref 0.44–1.00)
GFR calc non Af Amer: 28 mL/min — ABNORMAL LOW (ref >60)
GFR, EST AFRICAN AMERICAN: 33 mL/min — AB (ref >60)
GLUCOSE: 164 mg/dL — AB (ref 65–99)
POTASSIUM: 4 mmol/L (ref 3.5–5.1)
SODIUM: 143 mmol/L (ref 135–145)
TOTAL PROTEIN: 7.1 g/dL (ref 6.5–8.1)

## 2015-10-13 LAB — CBC WITH DIFFERENTIAL/PLATELET
BASOS PCT: 0 %
Basophils Absolute: 0 10*3/uL (ref 0.0–0.1)
EOS ABS: 0 10*3/uL (ref 0.0–0.7)
Eosinophils Relative: 0 %
HCT: 31.6 % — ABNORMAL LOW (ref 36.0–46.0)
Hemoglobin: 10.1 g/dL — ABNORMAL LOW (ref 12.0–15.0)
Lymphocytes Relative: 9 %
Lymphs Abs: 1 10*3/uL (ref 0.7–4.0)
MCH: 23.6 pg — AB (ref 26.0–34.0)
MCHC: 32 g/dL (ref 30.0–36.0)
MCV: 73.8 fL — ABNORMAL LOW (ref 78.0–100.0)
MONO ABS: 0.8 10*3/uL (ref 0.1–1.0)
MONOS PCT: 7 %
Neutro Abs: 9.5 10*3/uL — ABNORMAL HIGH (ref 1.7–7.7)
Neutrophils Relative %: 84 %
Platelets: 230 10*3/uL (ref 150–400)
RBC: 4.28 MIL/uL (ref 3.87–5.11)
RDW: 15.5 % (ref 11.5–15.5)
WBC: 11.3 10*3/uL — ABNORMAL HIGH (ref 4.0–10.5)

## 2015-10-13 LAB — I-STAT TROPONIN, ED: Troponin i, poc: 0 ng/mL (ref 0.00–0.08)

## 2015-10-13 NOTE — Discharge Instructions (Signed)
Pleural Effusion °A pleural effusion is an abnormal buildup of fluid in the layers of tissue between your lungs and the inside of your chest (pleural space). These two layers of tissue that line both your lungs and the inside of your chest are called pleura. Usually, there is no air in the space between the pleura, only a thin layer of fluid. If left untreated, a large amount of fluid can build up and cause the lung to collapse. A pleural effusion is usually caused by another disease that requires treatment. °The two main types of pleural effusion are: °· Transudative pleural effusion. This happens when fluid leaks into the pleural space because of a low protein count in your blood or high blood pressure in your vessels. Heart failure often causes this. °· Exudative infusion. This occurs when fluid collects in the pleural space from blocked blood vessels or lymph vessels. Some lung diseases, injuries, and cancers can cause this type of effusion. °CAUSES °Pleural effusion can be caused by: °· Heart failure. °· A blood clot in the lung (pulmonary embolism). °· Pneumonia. °· Cancer. °· Liver failure (cirrhosis). °· Kidney disease. °· Complications from surgery, such as from open heart surgery. °SIGNS AND SYMPTOMS °In some cases, pleural effusion may cause no symptoms. Symptoms can include: °· Shortness of breath, especially when lying down. °· Chest pain, often worse when taking a deep breath. °· Fever. °· Dry cough that is lasting (chronic). °· Hiccups. °· Rapid breathing. °An underlying condition that is causing the pleural effusion (such as heart failure, pneumonia, blood clots, tuberculosis, or cancer) may also cause additional symptoms. °DIAGNOSIS °Your health care provider may suspect pleural effusion based on your symptoms and medical history. Your health care provider will also do a physical exam and a chest X-ray. If the X-ray shows there is fluid in your chest, you may need to have this fluid removed using a  needle (thoracentesis) so it can be tested. °You may also have: °· Imaging studies of the chest, such as: °¨ Ultrasound. °¨ CT scan. °· Blood tests for kidney and liver function. °TREATMENT °Treatment depends on the cause of the pleural effusion. Treatment may include: °· Taking antibiotic medicines to clear up an infection that is causing the pleural effusion. °· Placing a tube in the chest to drain the effusion (tube thoracostomy). This procedure is often used when there is an infection in the fluid. °· Surgery to remove the fibrous outer layer of tissue from the pleural space (decortication). °· Thoracentesis, which can improve cough and shortness of breath. °· A procedure to put medicine into the chest cavity to seal the pleural space to prevent fluid buildup (pleurodesis). °· Chemotherapy and radiation therapy. These may be required in the case of cancerous (malignant) pleural effusion. °HOME CARE INSTRUCTIONS °· Take medicines only as directed by your health care provider. °· Keep track of how long you can gently exercise before you get short of breath. Try simply walking at first. °· Do not use any tobacco products, including cigarettes, chewing tobacco, or electronic cigarettes. If you need help quitting, ask your health care provider. °· Keep all follow-up visits as directed by your health care provider. This is important. °SEEK MEDICAL CARE IF: °· The amount of time that you are able to exercise decreases or does not improve with time. °· You have pain or signs of infection at the puncture site if you had thoracentesis. Watch for: °¨ Drainage. °¨ Redness. °¨ Swelling. °· You have a fever. °  SEEK IMMEDIATE MEDICAL CARE IF: °· You are short of breath. °· You develop chest pain. °· You develop a new cough. °MAKE SURE YOU: °· Understand these instructions. °· Will watch your condition. °· Will get help right away if you are not doing well or get worse. °  °This information is not intended to replace advice  given to you by your health care provider. Make sure you discuss any questions you have with your health care provider. °  °Document Released: 11/09/2005 Document Revised: 11/30/2014 Document Reviewed: 04/04/2014 °Elsevier Interactive Patient Education ©2016 [employer] ° °

## 2015-10-13 NOTE — ED Provider Notes (Signed)
CSN: [id number]     Arrival date & time 10/13/15  1134 History   First MD Initiated Contact with Patient 10/13/15 1212     Chief Complaint  Patient presents with  . Shortness of Breath      HPI Patient, with a history of breast cancer, Became very short of breath while dressing for church this AM. States that she is being treated a [facility] for her breast cancer. Shortness of breath subsides with rest and returns with activity. Family reports that Patient has fluid around her lungs. Family states that when she gets short of breath she coughs and that the sputum is swallowed Past Medical History  Diagnosis Date  . History of conization of cervix   . Hypertension   . Hypothyroidism   . Cancer [facility]) rt breast cancer 2004   Past Surgical History  Procedure Laterality Date  . Appendectomy  2011  . Cataract extraction, bilateral  2010    left eye  . Breast lumpectomy  2005    right  . Breast biopsy  07/09/2011    Procedure: BREAST BIOPSY;  Surgeon: Haywood Lasso, MD;  Location: [facility];  Service: General;  Laterality: Right;  preop diagnosis abnormal calcifications of right breast.  . Pars plana vitrectomy  10/21/2011    Procedure: PARS PLANA VITRECTOMY WITH 23 GAUGE;  Surgeon: Adonis Brook;  Location: [facility];  Service: Ophthalmology;  Laterality: Left;  Intraocular Lens Exchange   Family History  Problem Relation Age of Onset  . Prostate cancer Father   . Nephrotic syndrome Sister   . Breast cancer Sister   . Breast cancer Mother   . Pancreatic cancer Sister    Social History  Substance Use Topics  . Smoking status: Never Smoker   . Smokeless tobacco: None  . Alcohol Use: No     Comment: "wine once or twice a month"   OB History    No data available     Review of Systems  Respiratory: Positive for shortness of breath.   All other systems reviewed and are negative.     Allergies  Review of patient's allergies indicates no known allergies.  Home Medications    Prior to Admission medications   Medication Sig Start Date End Date Taking? Authorizing Provider  alendronate (FOSAMAX) 70 MG tablet Take 70 mg by mouth every 7 (seven) days. Take with a full glass of water on an empty stomach. Patient takes on Sunday.    Yes Historical Provider, MD  Calcium Carbonate-Vitamin D (CALCIUM-VITAMIN D) 500-200 MG-UNIT per tablet Take 1 tablet by mouth daily.    Yes Historical Provider, MD  cholecalciferol (VITAMIN D) 400 UNITS TABS Take 400 Units by mouth daily.     Yes Historical Provider, MD  HYDROcodone-acetaminophen (NORCO/VICODIN) 5-325 MG per tablet Take 1-2 tablets by mouth every 4 (four) hours as needed. 03/29/14   Lacretia Leigh, MD  letrozole [facility]) 2.5 MG tablet Take 2.5 mg by mouth daily.     Yes Historical Provider, MD  levothyroxine (SYNTHROID, LEVOTHROID) 75 MCG tablet Take 75 mcg by mouth daily before breakfast.   Yes Historical Provider, MD  lisinopril (PRINIVIL,ZESTRIL) 10 MG tablet Take 10 mg by mouth daily.   Yes Historical Provider, MD  megestrol (MEGACE) 20 MG tablet Take 20 mg by mouth 4 (four) times daily.   Yes Historical Provider, MD  metoprolol succinate (TOPROL-XL) 25 MG 24 hr tablet Take 25 mg by mouth daily.   Yes Historical Provider, MD  Multiple  Vitamin (MULTIVITAMIN WITH MINERALS) TABS tablet Take 1 tablet by mouth daily.   Yes Historical Provider, MD   BP 113/55 mmHg  Pulse 72  Temp(Src) 98.1 F (36.7 C) (Oral)  Resp 20  SpO2 98% Physical Exam  Constitutional: She is oriented to person, place, and time. She appears well-developed and well-nourished. No distress.  HENT:  Head: Normocephalic and atraumatic.  Eyes: Pupils are equal, round, and reactive to light.  Neck: Normal range of motion.  Cardiovascular: Normal rate and intact distal pulses.   Pulmonary/Chest: No accessory muscle usage. No respiratory distress. She has decreased breath sounds in the right lower field.  Abdominal: Normal appearance. She exhibits no  distension.  Musculoskeletal: Normal range of motion.  Neurological: She is alert and oriented to person, place, and time. No cranial nerve deficit.  Skin: Skin is warm and dry. No rash noted.  Psychiatric: She has a normal mood and affect. Her behavior is normal.  Nursing note and vitals reviewed.   ED Course  Procedures (including critical care time) Labs Review Labs Reviewed  CBC WITH DIFFERENTIAL/PLATELET - Abnormal; Notable for the following:    WBC 11.3 (*)    Hemoglobin 10.1 (*)    HCT 31.6 (*)    MCV 73.8 (*)    MCH 23.6 (*)    Neutro Abs 9.5 (*)    All other components within normal limits  COMPREHENSIVE METABOLIC PANEL - Abnormal; Notable for the following:    Chloride 112 (*)    Glucose, Bld 164 (*)    Creatinine, Ser 1.54 (*)    Albumin 3.1 (*)    GFR calc non Af Amer 28 (*)    GFR calc Af Amer 33 (*)    All other components within normal limits  URINALYSIS, ROUTINE W REFLEX MICROSCOPIC (NOT AT [facility])  Randolm Idol, ED    Imaging Review Dg Chest 2 View  10/13/2015  CLINICAL DATA:  History of breast cancer and pleural effusion. Very short of breath. EXAM: CHEST  2 VIEW COMPARISON:  10/12/2011 FINDINGS: New densities throughout the mid and lower right chest are compatible with a moderate sized right pleural effusion with compressive atelectasis. Difficult to exclude additional airspace disease in the right lung. Left lung is clear. Heart size is normal. Subchondral sclerosis involving the glenoids bilaterally, left side greater than right. Limited evaluation for compression fractures on the lateral view due to the pleural fluid. Patient has a known compression deformity in the mid thoracic spine. IMPRESSION: Moderate sized right pleural effusion with compressive atelectasis. Cannot exclude airspace disease in the right lung, particularly in the right upper lobe. Electronically Signed   By: Markus Daft M.D.   On: 10/13/2015 13:26   I have personally reviewed and  evaluated these images and lab results as part of my medical decision-making.   EKG Interpretation   Date/Time:  Sunday October 13 2015 11:51:48 EST Ventricular Rate:  82 PR Interval:  122 QRS Duration: 62 QT Interval:  376 QTC Calculation: 439 R Axis:   57 Text Interpretation:  Normal sinus rhythm Normal ECG Confirmed by Jhostin Epps   MD, Daine Gunther ([id number]) on 10/13/2015 12:23:47 PM     Review of lab and x-ray reports from [facility] shows pleural effusion was present on most recent chest x-ray. After treatment in the ED the patient feels back to baseline and wants to go home. MDM   Final diagnoses:  Malignant neoplasm of right female breast, unspecified site of breast (HCC)  Pleural effusion  Leonard Schwartz, MD 10/13/15 [phone]

## 2015-10-13 NOTE — ED Notes (Signed)
Granddaughter stated, she was getting ready for church and was getting SOB and to the point of coughing to get a breath.

## 2015-10-13 NOTE — ED Notes (Signed)
Patient, with a history of breast cancer, Became very short of breath while dressing for church this AM. States that she is being treated a [facility] for her breast cancer. Shortness of breath subsides with rest and returns with activity.  Family reports that  Patient has fluid around her lungs. Family states that when she gets short of breath she coughs and that the sputum is swallowed

## 2015-10-13 NOTE — ED Notes (Signed)
Walked patient around department. Sat 100% to 98% on room air. No Shortness of breath noted

## 2015-11-01 ENCOUNTER — Telehealth: Payer: Self-pay | Admitting: Hematology and Oncology

## 2015-11-01 NOTE — Telephone Encounter (Signed)
Spoke with patient son and give np appt for 12/12 @ 3:45 w/Dr. Lindi Adie

## 2015-11-04 ENCOUNTER — Encounter: Payer: Self-pay | Admitting: Hematology and Oncology

## 2015-11-04 ENCOUNTER — Ambulatory Visit (HOSPITAL_BASED_OUTPATIENT_CLINIC_OR_DEPARTMENT_OTHER): Payer: Medicare Other | Admitting: Hematology and Oncology

## 2015-11-04 VITALS — BP 120/57 | HR 88 | Temp 98.0°F | Resp 18 | Ht 61.0 in | Wt 113.6 lb

## 2015-11-04 DIAGNOSIS — Z79818 Long term (current) use of other agents affecting estrogen receptors and estrogen levels: Secondary | ICD-10-CM | POA: Diagnosis not present

## 2015-11-04 DIAGNOSIS — J90 Pleural effusion, not elsewhere classified: Secondary | ICD-10-CM | POA: Diagnosis not present

## 2015-11-04 DIAGNOSIS — C50511 Malignant neoplasm of lower-outer quadrant of right female breast: Secondary | ICD-10-CM

## 2015-11-04 DIAGNOSIS — Z23 Encounter for immunization: Secondary | ICD-10-CM | POA: Diagnosis not present

## 2015-11-04 MED ORDER — PNEUMOCOCCAL 13-VAL CONJ VACC IM SUSP
0.5000 mL | Freq: Once | INTRAMUSCULAR | Status: AC
  Administered 2015-11-04: 0.5 mL via INTRAMUSCULAR
  Filled 2015-11-04: qty 0.5

## 2015-11-04 MED ORDER — INFLUENZA VAC SPLIT QUAD 0.5 ML IM SUSY
0.5000 mL | PREFILLED_SYRINGE | Freq: Once | INTRAMUSCULAR | Status: AC
  Administered 2015-11-04: 0.5 mL via INTRAMUSCULAR
  Filled 2015-11-04: qty 0.5

## 2015-11-04 NOTE — Addendum Note (Signed)
Addended by: Prentiss Bells on: 11/04/2015 06:57 PM   Modules accepted: Orders, Medications

## 2015-11-04 NOTE — Progress Notes (Signed)
[place] NOTE  Patient Care Team: Wallene Huh, MD as PCP - General (Cardiology) Neldon Mc, MD as Surgeon (General Surgery) Chauncey Cruel, MD as Consulting Physician (Hematology and Oncology) Eldred Manges, MD as Consulting Physician (Obstetrics and Gynecology)  CHIEF COMPLAINTS/PURPOSE OF CONSULTATION:  Metastatic breast cancer  HISTORY OF PRESENTING ILLNESS:  Holly Patterson 81 y.o. female is here because of metastatic breast cancer. Her original cancer was in 2004 that was treated with lumpectomy and adjuvant radiation. She presented with second primary in August 2012 and in [place] the cancer center recommended hysterectomy. She went to [facility] for second opinion and they suggested palliative antiestrogen therapy. It appears that in 2012 she was diagnosed with metastatic disease to the right pleura. She went on Femara until January 2015 minute was changed to Aromasin because of enlarging axillary lymph nodes and metastases. She took Aromasin until August 2015 and treatment was changed to tamoxifen because of progression. Recent x-rays revealed worsening right pleural effusion. Bone scan done November 2016 showed stable findings in the bones. She was taken off tamoxifen and was started on Megace on 10/07/2015.  Patient wanted to see me because they wanted to have a local oncologist in case she gets into trouble. Recently her family thought she was hypoxic and took her to the emergency room but it was felt to be not true since her pulse oximeter is showed very good oxygen saturation. In order to wind running to the emergency room, they wanted to have a local oncologist to check up on her if needed.  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  SUMMARY OF ONCOLOGIC HISTORY:   Breast cancer of lower-outer quadrant of right female breast ([place])   07/16/2003 Initial Diagnosis Right breast: Invasive lobular cancer, 0.9 cm T1 BN 0 stage IA, ER/PR HER-2/neu unknown, grade 1, initially excision biopsy and SLN, reexcision lumpectomy 08/14/2003, no residual cancer   08/20/2003 - 09/19/2003 Radiation Therapy Adjuvant radiation therapy, but no chemotherapy or hormone therapy given   07/09/2011 Relapse/Recurrence New primary versus local recurrence multifocal largest 2 cm, T1c NX, ER 90%, PR 5%, HER-2 negative, grade 2, IDC (patient was offered mastectomy at [facility], went to [facility] and went on palliative antiestrogen treatment)   08/19/2011 PET scan Suspicious for metastases to right pleura and lymph nodes   10/05/2011 Procedure Genetic testing revealed BRCA1 mutation, the same as her sisters   10/05/2011 - 12/04/2013 Anti-estrogen oral therapy Femara once daily until progression by CT and bone scan 12/08/2013 with enlarging left axillary lymph nodes and bone metastases.   12/19/2013 - 07/13/2014 Anti-estrogen oral therapy Aromasin 25 mg daily progression by physical exam 07/13/2014 with skin changes on the right breast   07/04/2014 - 10/04/2015 Anti-estrogen oral therapy Tamoxifen 20 mg daily, metastasis to T10 and L1. Bone scan 07/27/2014; chest x-ray showed right pleural effusion, bone scan was stable   10/07/2015 -  Anti-estrogen oral therapy Patient was recommended to start Megace for palliative intent.   MEDICAL HISTORY:  Past Medical History  Diagnosis Date  . History of conization of cervix   . Hypertension   . Hypothyroidism   . Cancer [facility]) rt breast cancer 2004    SURGICAL HISTORY: Past Surgical History  Procedure Laterality Date  . Appendectomy  2011  . Cataract extraction, bilateral  2010    left eye  . Breast lumpectomy  2005  right  . Breast biopsy  07/09/2011    Procedure: BREAST BIOPSY;  Surgeon: Haywood Lasso, MD;  Location: [facility];  Service: General;  Laterality: Right;  preop diagnosis abnormal calcifications of right  breast.  . Pars plana vitrectomy  10/21/2011    Procedure: PARS PLANA VITRECTOMY WITH 23 GAUGE;  Surgeon: Adonis Brook;  Location: [facility];  Service: Ophthalmology;  Laterality: Left;  Intraocular Lens Exchange    SOCIAL HISTORY: Social History   Social History  . Marital Status: Widowed    Spouse Name: N/A  . Number of Children: N/A  . Years of Education: N/A   Occupational History  . Not on file.   Social History Main Topics  . Smoking status: Never Smoker   . Smokeless tobacco: Not on file  . Alcohol Use: No     Comment: "wine once or twice a month"  . Drug Use: No  . Sexual Activity: No   Other Topics Concern  . Not on file   Social History Narrative    FAMILY HISTORY: Family History  Problem Relation Age of Onset  . Prostate cancer Father   . Nephrotic syndrome Sister   . Breast cancer Sister   . Breast cancer Mother   . Pancreatic cancer Sister     ALLERGIES:  has No Known Allergies.  MEDICATIONS:  Current Outpatient Prescriptions  Medication Sig Dispense Refill  . alendronate (FOSAMAX) 70 MG tablet Take 70 mg by mouth every 7 (seven) days. Take with a full glass of water on an empty stomach. Patient takes on Sunday.     . Calcium Carbonate-Vitamin D (CALCIUM-VITAMIN D) 500-200 MG-UNIT per tablet Take 1 tablet by mouth daily.     . cholecalciferol (VITAMIN D) 400 UNITS TABS Take 400 Units by mouth daily.      Marland Kitchen HYDROcodone-acetaminophen (NORCO/VICODIN) 5-325 MG per tablet Take 1-2 tablets by mouth every 4 (four) hours as needed. 10 tablet 0  . letrozole (FEMARA) 2.5 MG tablet Take 2.5 mg by mouth daily.      Marland Kitchen levothyroxine (SYNTHROID, LEVOTHROID) 75 MCG tablet Take 75 mcg by mouth daily before breakfast.    . lisinopril (PRINIVIL,ZESTRIL) 10 MG tablet Take 10 mg by mouth daily.    . megestrol (MEGACE) 20 MG tablet Take 20 mg by mouth 4 (four) times daily.    . metoprolol succinate (TOPROL-XL) 25 MG 24 hr tablet Take 25 mg by mouth daily.    . Multiple  Vitamin (MULTIVITAMIN WITH MINERALS) TABS tablet Take 1 tablet by mouth daily.     Current Facility-Administered Medications  Medication Dose Route Frequency Provider Last Rate Last Dose  . Influenza vac split quadrivalent PF (FLUARIX) injection 0.5 mL  0.5 mL Intramuscular Once Nicholas Lose, MD      . pneumococcal 13-valent conjugate vaccine (PREVNAR 13) injection 0.5 mL  0.5 mL Intramuscular Once Nicholas Lose, MD        REVIEW OF SYSTEMS:   Constitutional: Denies fevers, chills or abnormal night sweats Eyes: Denies blurriness of vision, double vision or watery eyes Ears, nose, mouth, throat, and face: Denies mucositis or sore throat Respiratory: Denies cough, dyspnea or wheezes Cardiovascular: Denies palpitation, chest discomfort or lower extremity swelling Gastrointestinal:  Denies nausea, heartburn or change in bowel habits Skin: Denies abnormal skin rashes Lymphatics: Denies new lymphadenopathy or easy bruising Neurological:Denies numbness, tingling or new weaknesses Behavioral/Psych: Mood is stable, no new changes  Breast: Scar in the right breast All other systems were reviewed with the  patient and are negative.  PHYSICAL EXAMINATION: ECOG PERFORMANCE STATUS: 1 - Symptomatic but completely ambulatory  Filed Vitals:   11/04/15 1558  BP: 120/57  Pulse: 88  Temp: 98 F (36.7 C)  Resp: 18   Filed Weights   11/04/15 1558  Weight: 113 lb 9.6 oz (51.529 kg)    GENERAL:alert, no distress and comfortable SKIN: skin color, texture, turgor are normal, no rashes or significant lesions EYES: normal, conjunctiva are pink and non-injected, sclera clear OROPHARYNX:no exudate, no erythema and lips, buccal mucosa, and tongue normal  NECK: supple, thyroid normal size, non-tender, without nodularity LYMPH:  no palpable lymphadenopathy in the cervical, axillary or inguinal LUNGS: clear to auscultation and percussion with normal breathing effort HEART: regular rate & rhythm and no  murmurs and no lower extremity edema ABDOMEN:abdomen soft, non-tender and normal bowel sounds Musculoskeletal:no cyanosis of digits and no clubbing  PSYCH: alert & oriented x 3 with fluent speech NEURO: no focal motor/sensory deficits BREAST: Right breast scar related to prior surgery. No palpable axillary or supraclavicular lymphadenopathy (exam performed in the presence of a chaperone)   LABORATORY DATA:  I have reviewed the data as listed Lab Results  Component Value Date   WBC 11.3* 10/13/2015   HGB 10.1* 10/13/2015   HCT 31.6* 10/13/2015   MCV 73.8* 10/13/2015   PLT 230 10/13/2015   Lab Results  Component Value Date   NA 143 10/13/2015   K 4.0 10/13/2015   CL 112* 10/13/2015   CO2 23 10/13/2015    RADIOGRAPHIC STUDIES: I have personally reviewed the radiological reports and agreed with the findings in the report.  ASSESSMENT AND PLAN:  Breast cancer of lower-outer quadrant of right female breast ([place]) Right breast invasive lobular cancer diagnosed in 2004 T1b N0 stage IA, ER/PR HER-2 unknown, grade 1, treated with lumpectomy followed by radiation. In 2012 local recurrence versus new primary multifocal T1c 2 cm N0 M0, ER 90%, PR 5%, HER-2/neu negative, grade 2, invasive ductal carcinoma (she did not get surgery)  Antiestrogen therapy: Femara was started 09/2011, discontinued 11/2013 due to progression of left axillary lymph node and bone metastases, Aromasin given 12/19/2013 to 07/13/2014 progression by skin changes in the right breast; tamoxifen started 07/12/2014 completed November 2016 due to recurrent right pleural effusion.  Patient's oncology care is being guided by Dr. Michele Mcalpine at [facility]. Patient's family wants me to be available locally in case she gets into any trouble. We are happy to assist her in any think she needs from oncology standpoint.  Pleural effusion: Moderate amount: If her breathing gets worse, we can set her up for a thoracentesis. Currently I feel that her  symptoms are not severe enough to warrant it. We discussed the pros and cons of thoracentesis, she told me that, if it isn't broke, don't fix it.  Dietitian consult: Patient's family is requesting dietary consult to assist her in gaining weight. She also has poor appetite. Megace was started a month ago and so far she has not noticed any change in appetite.  Current treatment: Megace once daily. Flu vaccine and pneumonia vaccine were given today.  Return to clinic in 4 months for routine follow-up. I instructed the family to call me if I can be of any assistance.  All questions were answered. The patient knows to call the clinic with any problems, questions or concerns.    Rulon Eisenmenger, MD 4:38 PM

## 2015-11-04 NOTE — Assessment & Plan Note (Addendum)
Right breast invasive lobular cancer diagnosed in 2004 T1b N0 stage IA, ER/PR HER-2 unknown, grade 1, treated with lumpectomy followed by radiation. In 2012 local recurrence versus new primary multifocal T1c 2 cm N0 M0, ER 90%, PR 5%, HER-2/neu negative, grade 2, invasive ductal carcinoma (she did not get surgery)  Antiestrogen therapy: Femara was started 09/2011, discontinued 11/2013 due to progression of left axillary lymph node and bone metastases, Aromasin given 12/19/2013 to 07/13/2014 progression by skin changes in the right breast; tamoxifen started 07/12/2014 completed November 2016 due to recurrent right pleural effusion.  Patient's oncology care is being guided by Dr. Michele Mcalpine at [facility]. Patient's family wants me to be available locally in case she gets into any trouble. We are happy to assist her in any think she needs from oncology standpoint.  Pleural effusion: Moderate amount: If her breathing gets worse, we can set her up for a thoracentesis. Currently I feel that her symptoms are not severe enough to warrant it. We discussed the pros and cons of thoracentesis, she told me that, if it isn't broke, don't fix it.  Dietitian consult: Patient's family is requesting podiatry consult to assist her in gaining weight. She also has poor appetite. Megace was started a month ago and so far she has not noticed any change in appetite.  Current treatment: Megace once daily. Flu vaccine and pneumonia vaccine were given today.  Return to clinic in 4 months for routine follow-up. I instructed the family to call me if I can be of any assistance.

## 2015-11-05 ENCOUNTER — Telehealth: Payer: Self-pay | Admitting: Hematology and Oncology

## 2015-11-05 ENCOUNTER — Other Ambulatory Visit: Payer: Self-pay | Admitting: Hematology and Oncology

## 2015-11-05 DIAGNOSIS — R630 Anorexia: Secondary | ICD-10-CM | POA: Insufficient documentation

## 2015-11-05 NOTE — Telephone Encounter (Signed)
spoke with patient and she is aware of her follow up and i have mailed her a calendar as well

## 2015-11-19 ENCOUNTER — Other Ambulatory Visit: Payer: Self-pay | Admitting: *Deleted

## 2015-11-19 DIAGNOSIS — C50511 Malignant neoplasm of lower-outer quadrant of right female breast: Secondary | ICD-10-CM

## 2015-11-21 ENCOUNTER — Other Ambulatory Visit: Payer: Self-pay | Admitting: *Deleted

## 2015-11-21 DIAGNOSIS — C50511 Malignant neoplasm of lower-outer quadrant of right female breast: Secondary | ICD-10-CM

## 2015-11-22 ENCOUNTER — Ambulatory Visit (HOSPITAL_BASED_OUTPATIENT_CLINIC_OR_DEPARTMENT_OTHER): Payer: Medicare Other

## 2015-11-22 VITALS — BP 91/71 | HR 81 | Temp 98.0°F | Resp 17

## 2015-11-22 DIAGNOSIS — C50511 Malignant neoplasm of lower-outer quadrant of right female breast: Secondary | ICD-10-CM | POA: Diagnosis present

## 2015-11-22 MED ORDER — SODIUM CHLORIDE 0.9 % IV SOLN
Freq: Once | INTRAVENOUS | Status: AC
  Administered 2015-11-22: 09:00:00 via INTRAVENOUS
  Filled 2015-11-22: qty 500

## 2015-11-24 ENCOUNTER — Emergency Department (HOSPITAL_COMMUNITY): Payer: Medicare Other

## 2015-11-24 ENCOUNTER — Other Ambulatory Visit: Payer: Self-pay

## 2015-11-24 ENCOUNTER — Inpatient Hospital Stay (HOSPITAL_COMMUNITY)
Admission: EM | Admit: 2015-11-24 | Discharge: 2015-11-27 | DRG: 597 | Disposition: A | Discharge status: Hospice | Payer: Medicare Other | Attending: Internal Medicine | Admitting: Internal Medicine

## 2015-11-24 ENCOUNTER — Encounter (HOSPITAL_COMMUNITY): Payer: Self-pay | Admitting: Nurse Practitioner

## 2015-11-24 DIAGNOSIS — E039 Hypothyroidism, unspecified: Secondary | ICD-10-CM | POA: Diagnosis present

## 2015-11-24 DIAGNOSIS — Z66 Do not resuscitate: Secondary | ICD-10-CM | POA: Diagnosis present

## 2015-11-24 DIAGNOSIS — Z87891 Personal history of nicotine dependence: Secondary | ICD-10-CM

## 2015-11-24 DIAGNOSIS — I82409 Acute embolism and thrombosis of unspecified deep veins of unspecified lower extremity: Secondary | ICD-10-CM | POA: Diagnosis not present

## 2015-11-24 DIAGNOSIS — N179 Acute kidney failure, unspecified: Secondary | ICD-10-CM | POA: Diagnosis present

## 2015-11-24 DIAGNOSIS — R000 Tachycardia, unspecified: Secondary | ICD-10-CM | POA: Diagnosis present

## 2015-11-24 DIAGNOSIS — E860 Dehydration: Secondary | ICD-10-CM | POA: Diagnosis present

## 2015-11-24 DIAGNOSIS — J90 Pleural effusion, not elsewhere classified: Secondary | ICD-10-CM | POA: Diagnosis present

## 2015-11-24 DIAGNOSIS — R7989 Other specified abnormal findings of blood chemistry: Secondary | ICD-10-CM | POA: Diagnosis not present

## 2015-11-24 DIAGNOSIS — N189 Chronic kidney disease, unspecified: Secondary | ICD-10-CM | POA: Diagnosis present

## 2015-11-24 DIAGNOSIS — I129 Hypertensive chronic kidney disease with stage 1 through stage 4 chronic kidney disease, or unspecified chronic kidney disease: Secondary | ICD-10-CM | POA: Diagnosis present

## 2015-11-24 DIAGNOSIS — Z7982 Long term (current) use of aspirin: Secondary | ICD-10-CM

## 2015-11-24 DIAGNOSIS — Z853 Personal history of malignant neoplasm of breast: Secondary | ICD-10-CM

## 2015-11-24 DIAGNOSIS — Z79899 Other long term (current) drug therapy: Secondary | ICD-10-CM | POA: Diagnosis not present

## 2015-11-24 DIAGNOSIS — R748 Abnormal levels of other serum enzymes: Secondary | ICD-10-CM | POA: Diagnosis present

## 2015-11-24 DIAGNOSIS — J910 Malignant pleural effusion: Secondary | ICD-10-CM | POA: Diagnosis present

## 2015-11-24 DIAGNOSIS — I82401 Acute embolism and thrombosis of unspecified deep veins of right lower extremity: Secondary | ICD-10-CM | POA: Diagnosis present

## 2015-11-24 DIAGNOSIS — I10 Essential (primary) hypertension: Secondary | ICD-10-CM

## 2015-11-24 DIAGNOSIS — Z9889 Other specified postprocedural states: Secondary | ICD-10-CM

## 2015-11-24 DIAGNOSIS — Z515 Encounter for palliative care: Secondary | ICD-10-CM | POA: Diagnosis not present

## 2015-11-24 DIAGNOSIS — R778 Other specified abnormalities of plasma proteins: Secondary | ICD-10-CM | POA: Diagnosis present

## 2015-11-24 DIAGNOSIS — Z923 Personal history of irradiation: Secondary | ICD-10-CM

## 2015-11-24 DIAGNOSIS — Z79818 Long term (current) use of other agents affecting estrogen receptors and estrogen levels: Secondary | ICD-10-CM

## 2015-11-24 DIAGNOSIS — N289 Disorder of kidney and ureter, unspecified: Secondary | ICD-10-CM

## 2015-11-24 DIAGNOSIS — C50911 Malignant neoplasm of unspecified site of right female breast: Principal | ICD-10-CM | POA: Diagnosis present

## 2015-11-24 DIAGNOSIS — R0600 Dyspnea, unspecified: Secondary | ICD-10-CM | POA: Diagnosis present

## 2015-11-24 DIAGNOSIS — I2699 Other pulmonary embolism without acute cor pulmonale: Secondary | ICD-10-CM | POA: Diagnosis present

## 2015-11-24 HISTORY — DX: Pleural effusion, not elsewhere classified: J90

## 2015-11-24 LAB — LACTIC ACID, PLASMA: LACTIC ACID, VENOUS: 2.8 mmol/L — AB (ref 0.5–2.0)

## 2015-11-24 LAB — BASIC METABOLIC PANEL
ANION GAP: 12 (ref 5–15)
BUN: 22 mg/dL — ABNORMAL HIGH (ref 6–20)
CALCIUM: 9.3 mg/dL (ref 8.9–10.3)
CO2: 16 mmol/L — AB (ref 22–32)
Chloride: 114 mmol/L — ABNORMAL HIGH (ref 101–111)
Creatinine, Ser: 1.96 mg/dL — ABNORMAL HIGH (ref 0.44–1.00)
GFR calc Af Amer: 24 mL/min — ABNORMAL LOW (ref >60)
GFR calc non Af Amer: 21 mL/min — ABNORMAL LOW (ref >60)
GLUCOSE: 229 mg/dL — AB (ref 65–99)
POTASSIUM: 4.4 mmol/L (ref 3.5–5.1)
Sodium: 142 mmol/L (ref 135–145)

## 2015-11-24 LAB — TROPONIN I
TROPONIN I: 2.34 ng/mL — AB (ref <0.031)
Troponin I: 2.78 ng/mL (ref <0.031)

## 2015-11-24 LAB — CBC
HEMATOCRIT: 30.5 % — AB (ref 36.0–46.0)
HEMOGLOBIN: 10 g/dL — AB (ref 12.0–15.0)
MCH: 24.1 pg — AB (ref 26.0–34.0)
MCHC: 32.8 g/dL (ref 30.0–36.0)
MCV: 73.5 fL — ABNORMAL LOW (ref 78.0–100.0)
Platelets: 148 10*3/uL — ABNORMAL LOW (ref 150–400)
RBC: 4.15 MIL/uL (ref 3.87–5.11)
RDW: 17 % — ABNORMAL HIGH (ref 11.5–15.5)
WBC: 10.6 10*3/uL — ABNORMAL HIGH (ref 4.0–10.5)

## 2015-11-24 LAB — I-STAT TROPONIN, ED: Troponin i, poc: 1.7 ng/mL (ref 0.00–0.08)

## 2015-11-24 LAB — BRAIN NATRIURETIC PEPTIDE: B Natriuretic Peptide: 989.8 pg/mL — ABNORMAL HIGH (ref 0.0–100.0)

## 2015-11-24 MED ORDER — MEGESTROL ACETATE 40 MG PO TABS
40.0000 mg | ORAL_TABLET | Freq: Four times a day (QID) | ORAL | Status: DC
  Administered 2015-11-25 – 2015-11-27 (×10): 40 mg via ORAL
  Filled 2015-11-24 (×14): qty 1

## 2015-11-24 MED ORDER — DOCUSATE SODIUM 100 MG PO CAPS
100.0000 mg | ORAL_CAPSULE | Freq: Every day | ORAL | Status: DC
  Administered 2015-11-25 – 2015-11-27 (×3): 100 mg via ORAL
  Filled 2015-11-24 (×3): qty 1

## 2015-11-24 MED ORDER — LEVOTHYROXINE SODIUM 75 MCG PO TABS
75.0000 ug | ORAL_TABLET | Freq: Every day | ORAL | Status: DC
  Administered 2015-11-26 – 2015-11-27 (×2): 75 ug via ORAL
  Filled 2015-11-24 (×2): qty 1

## 2015-11-24 MED ORDER — SODIUM CHLORIDE 0.9 % IJ SOLN
3.0000 mL | Freq: Two times a day (BID) | INTRAMUSCULAR | Status: DC
  Administered 2015-11-25 – 2015-11-27 (×3): 3 mL via INTRAVENOUS

## 2015-11-24 MED ORDER — HEPARIN (PORCINE) IN NACL 100-0.45 UNIT/ML-% IJ SOLN
700.0000 [IU]/h | INTRAMUSCULAR | Status: DC
  Administered 2015-11-24: 700 [IU]/h via INTRAVENOUS
  Filled 2015-11-24: qty 250

## 2015-11-24 MED ORDER — LEVALBUTEROL HCL 0.63 MG/3ML IN NEBU: 0.6300 mg | INHALATION_SOLUTION | Freq: Four times a day (QID) | RESPIRATORY_TRACT | Status: DC | PRN

## 2015-11-24 MED ORDER — ASPIRIN EC 81 MG PO TBEC
81.0000 mg | DELAYED_RELEASE_TABLET | Freq: Every day | ORAL | Status: DC
  Administered 2015-11-25 – 2015-11-27 (×3): 81 mg via ORAL
  Filled 2015-11-24 (×3): qty 1

## 2015-11-24 MED ORDER — HYDROCODONE-ACETAMINOPHEN 5-325 MG PO TABS: 1.0000 | ORAL_TABLET | ORAL | Status: DC | PRN

## 2015-11-24 MED ORDER — ONDANSETRON HCL 4 MG/2ML IJ SOLN: 4.0000 mg | Freq: Four times a day (QID) | INTRAMUSCULAR | Status: DC | PRN

## 2015-11-24 MED ORDER — ONDANSETRON HCL 4 MG PO TABS: 4.0000 mg | ORAL_TABLET | Freq: Four times a day (QID) | ORAL | Status: DC | PRN

## 2015-11-24 MED ORDER — ACETAMINOPHEN 650 MG RE SUPP: 650.0000 mg | Freq: Four times a day (QID) | RECTAL | Status: DC | PRN

## 2015-11-24 MED ORDER — SODIUM CHLORIDE 0.9 % IV SOLN: INTRAVENOUS | Status: DC

## 2015-11-24 MED ORDER — HEPARIN BOLUS VIA INFUSION
1500.0000 [IU] | Freq: Once | INTRAVENOUS | Status: AC
  Administered 2015-11-24: 1500 [IU] via INTRAVENOUS
  Filled 2015-11-24: qty 1500

## 2015-11-24 MED ORDER — ACETAMINOPHEN 325 MG PO TABS: 650.0000 mg | ORAL_TABLET | Freq: Four times a day (QID) | ORAL | Status: DC | PRN

## 2015-11-24 NOTE — H&P (Signed)
PCP: Patricia Nettle, MD  Oncologist Garnette Czech Referring provider [facility]   Chief Complaint:  Tachycardia while at home  HPI: Holly Patterson is a 82 y.o. female   has a past medical history of History of conization of cervix; Hypertension; Hypothyroidism; Cancer [facility]) (rt breast cancer 2004); Breast cancer ([place]); Hearing deficit; Cognitive impairment; and Pleural effusion.   Presented with  Today she was brought in by her daughter secondary to shortness of breath and inability to walk across a room she had some chest tightness last night but not currently. No fever, patient has been somewhat more lethargic than his baseline. Have had gradual deterioration. Family states she is not eating or drinking well. On Friday she had fluid resuscitation she seemed to be better but then got a bit worse.  Family noted normal oxygen levels but today she had pulse ox in 92%.  Patient's daughter noted that patient's heart rate has been going up when she is trying to ambulate even a little bit. Patient has right breast invasive lobular cancer diagnosed in 2004 treated with lumpectomy and radiation with local recurrence in 2012 was on antiestrogen therapy which has been discontinued to this time. She has recurrent right pleural effusion. Patient has been seen by Dr. chemotherapy at [facility] regarding oncology issues. Patient has not been eating well which was treated with Megace. Regarding her pleural effusion she is scheduled for thoracentesis this week.   In emergency department patient was found to have troponin elevated up to 2.78 her creatinine was up from baseline of 1.5 to1.96. Chest CT showing chronic large right pleural effusion and small bone lesions and thoracic spine  Dear provider has initiated heparin drip Hospitalist was called for admission for symptomatic pleural effusion elevated troponin   Review of Systems:    Pertinent positives include: Fevers,  Constitutional:  No  weight loss, night sweats,  chills, fatigue, weight loss  HEENT:  No headaches, Difficulty swallowing,Tooth/dental problems,Sore throat,  No sneezing, itching, ear ache, nasal congestion, post nasal drip,  Cardio-vascular:  No chest pain, Orthopnea, PND, anasarca, dizziness, palpitations.no Bilateral lower extremity swelling  GI:  No heartburn, indigestion, abdominal pain, nausea, vomiting, diarrhea, change in bowel habits, loss of appetite, melena, blood in stool, hematemesis Resp:  no shortness of breath at rest. No dyspnea on exertion, No excess mucus, no productive cough, No non-productive cough, No coughing up of blood.No change in color of mucus.No wheezing. Skin:  no rash or lesions. No jaundice GU:  no dysuria, change in color of urine, no urgency or frequency. No straining to urinate.  No flank pain.  Musculoskeletal:  No joint pain or no joint swelling. No decreased range of motion. No back pain.  Psych:  No change in mood or affect. No depression or anxiety. No memory loss.  Neuro: no localizing neurological complaints, no tingling, no weakness, no double vision, no gait abnormality, no slurred speech, no confusion  Otherwise ROS are negative except for above, 10 systems were reviewed  Past Medical History: Past Medical History  Diagnosis Date  . History of conization of cervix   . Hypertension   . Hypothyroidism   . Cancer ([place]) rt breast cancer 2004  . Breast cancer ([place])   . Hearing deficit   . Cognitive impairment   . Pleural effusion    Past Surgical History  Procedure Laterality Date  . Appendectomy  2011  . Cataract extraction, bilateral  2010    left eye  . Breast lumpectomy  2005    right  . Breast biopsy  07/09/2011    Procedure: BREAST BIOPSY;  Surgeon: Haywood Lasso, MD;  Location: [facility];  Service: General;  Laterality: Right;  preop diagnosis abnormal calcifications of right breast.  . Pars plana vitrectomy  10/21/2011    Procedure: PARS PLANA  VITRECTOMY WITH 23 GAUGE;  Surgeon: Adonis Brook;  Location: [facility];  Service: Ophthalmology;  Laterality: Left;  Intraocular Lens Exchange  . Cataract extraction    . Dilatation & curettage/hysteroscopy with myosure       Medications: Prior to Admission medications   Medication Sig Start Date End Date Taking? Authorizing Provider  aspirin 81 MG tablet Take 81 mg by mouth daily.    Yes Historical Provider, MD  cholecalciferol (VITAMIN D) 400 UNITS TABS Take 1,000 Units by mouth daily.    Yes Historical Provider, MD  docusate sodium (COLACE) 100 MG capsule Take 100 mg by mouth daily.   Yes Historical Provider, MD  levothyroxine (SYNTHROID, LEVOTHROID) 75 MCG tablet Take 75 mcg by mouth daily before breakfast.   Yes Historical Provider, MD  losartan (COZAAR) 50 MG tablet Take 50 mg by mouth daily. 09/07/15  Yes Historical Provider, MD  Lutein-Zeaxanthin 25-5 MG CAPS Take 1 capsule by mouth daily.   Yes Historical Provider, MD  megestrol (MEGACE) 40 MG tablet Take 40 mg by mouth 4 (four) times daily. 10/26/15  Yes Historical Provider, MD  Multiple Vitamin (MULTIVITAMIN WITH MINERALS) TABS tablet Take 1 tablet by mouth daily.   Yes Historical Provider, MD  HYDROcodone-acetaminophen (NORCO/VICODIN) 5-325 MG per tablet Take 1-2 tablets by mouth every 4 (four) hours as needed. Patient not taking: Reported on 11/04/2015 03/29/14   Lacretia Leigh, MD    Allergies:  No Known Allergies  Social History:  Ambulatory  independently   Lives at home   With family     reports that she has quit smoking. She does not have any smokeless tobacco history on file. She reports that she does not drink alcohol or use illicit drugs.     Family History: family history includes Breast cancer in her mother and sister; Nephrotic syndrome in her sister; Pancreatic cancer in her sister; Prostate cancer in her father.    Physical Exam: Patient Vitals for the past 24 hrs:  BP Temp Temp src Pulse Resp SpO2  11/24/15  1945 123/77 mmHg - - 96 18 98 %  11/24/15 1907 130/74 mmHg - - - - -  11/24/15 1900 - - - 98 (!) 29 96 %  11/24/15 1839 - - - - - 99 %  11/24/15 1757 145/80 mmHg 97.8 F (36.6 C) Oral 101 18 97 %    1. General:  in No Acute distress 2. Psychological: Alert and Oriented 3. Head/ENT:  Dry Mucous Membranes                          Head Non traumatic, neck supple                          Normal   Dentition 4. SKIN:  decreased Skin turgor,  Skin clean Dry and intact no rash 5. Heart: Regular rate and rhythm no Murmur, Rub or gallop 6. Lungs:  no wheezes or crackles  Decreased breath sounds on the right 7. Abdomen: Soft, non-tender, Non distended 8. Lower extremities: no clubbing, cyanosis, or edema 9. Neurologically Grossly intact, moving all 4  extremities equally 10. MSK: Normal range of motion  body mass index is unknown because there is no weight on file.   Labs on Admission:   Results for orders placed or performed during the hospital encounter of 11/24/15 (from the past 24 hour(s))  Basic metabolic panel     Status: Abnormal   Collection Time: 11/24/15  6:04 PM  Result Value Ref Range   Sodium 142 135 - 145 mmol/L   Potassium 4.4 3.5 - 5.1 mmol/L   Chloride 114 (H) 101 - 111 mmol/L   CO2 16 (L) 22 - 32 mmol/L   Glucose, Bld 229 (H) 65 - 99 mg/dL   BUN 22 (H) 6 - 20 mg/dL   Creatinine, Ser 1.96 (H) 0.44 - 1.00 mg/dL   Calcium 9.3 8.9 - 10.3 mg/dL   GFR calc non Af Amer 21 (L) >60 mL/min   GFR calc Af Amer 24 (L) >60 mL/min   Anion gap 12 5 - 15  CBC     Status: Abnormal   Collection Time: 11/24/15  6:04 PM  Result Value Ref Range   WBC 10.6 (H) 4.0 - 10.5 K/uL   RBC 4.15 3.87 - 5.11 MIL/uL   Hemoglobin 10.0 (L) 12.0 - 15.0 g/dL   HCT 30.5 (L) 36.0 - 46.0 %   MCV 73.5 (L) 78.0 - 100.0 fL   MCH 24.1 (L) 26.0 - 34.0 pg   MCHC 32.8 30.0 - 36.0 g/dL   RDW 17.0 (H) 11.5 - 15.5 %   Platelets 148 (L) 150 - 400 K/uL  Brain natriuretic peptide     Status: Abnormal    Collection Time: 11/24/15  6:04 PM  Result Value Ref Range   B Natriuretic Peptide 989.8 (H) 0.0 - 100.0 pg/mL  Troponin I     Status: Abnormal   Collection Time: 11/24/15  6:04 PM  Result Value Ref Range   Troponin I 2.78 (HH) <0.031 ng/mL  I-stat troponin, ED (not at [facility], [facility])     Status: Abnormal   Collection Time: 11/24/15  6:13 PM  Result Value Ref Range   Troponin i, poc 1.70 (HH) 0.00 - 0.08 ng/mL   Comment NOTIFIED PHYSICIAN    Comment 3            UA ordered  No results found for: HGBA1C  CrCl cannot be calculated (Unknown ideal weight.).  BNP (last 3 results) No results for input(s): PROBNP in the last 8760 hours.  Other results:  I have pearsonaly reviewed this: ECG REPORT  Rate: 104  Rhythm: Sinus tachycardia ST&T Change: T wave flattening Normal QTC  There were no vitals filed for this visit.   Cultures:    Component Value Date/Time   SDES URINE, RANDOM 02/04/2010 1215   SPECREQUEST NONE 02/04/2010 1215   CULT  02/04/2010 1215    Multiple bacterial morphotypes present, none predominant. Suggest appropriate recollection if clinically indicated.   REPTSTATUS 02/06/2010 FINAL 02/04/2010 1215     Radiological Exams on Admission: Dg Chest 2 View  11/24/2015  CLINICAL DATA:  Shortness of breath and chest tightness for 2 days. EXAM: CHEST  2 VIEW COMPARISON:  10/13/2015 FINDINGS: Two-view exam again shows small right pleural effusion with right parahilar and infrahilar ill-defined opacity. Prominence of the minor fissure again noted. Left lung is clear. The cardiopericardial silhouette is within normal limits for size. The visualized bony structures of the thorax are intact. IMPRESSION: Stable exam. Small right pleural effusion with right parahilar and infrahilar opacity.  Unilateral pleural effusion can be a sign of malignancy and close follow-up recommended. Electronically Signed   By: Misty Stanley M.D.   On: 11/24/2015 18:23   Ct Chest Wo  Contrast  11/24/2015  CLINICAL DATA:  Shortness of breath. Right pleural effusion. The patient is reportedly being treated for breast cancer at [facility]. EXAM: CT CHEST WITHOUT CONTRAST TECHNIQUE: Multidetector CT imaging of the chest was performed following the standard protocol without IV contrast. COMPARISON:  Chest x-rays dated 11/24/2015 and 10/13/2015 and 10/12/2011 FINDINGS: There is a large right pleural effusion. There is a partially calcified soft tissue mass on the right hemidiaphragm. This could represent scar tissue or treated tumor. The patient also has multiple calcified lymph nodes in the right hilum. There is a 21 mm masslike density in the lateral aspect of the right breast underlying the surgical scar. Heart size is normal. There is slight atelectasis or scarring in the lingula and multiple lesions in the thoracic spine, most likely representing metastatic breast cancer. The patient has fairly severe glenohumeral joint arthritis bilaterally. Old compression fracture of T8. IMPRESSION: 1. Chronic large right pleural effusion. 2. Multiple small bone lesions in the thoracic spine which could represent breast cancer metastases. 3. Small focal area of atelectasis or scarring in the lingula. 4. Soft tissue density in the lateral aspect of the right breast probably represents postsurgical change although I cannot exclude a mass. No prior studies of that area for comparison. Electronically Signed   By: Lorriane Shire M.D.   On: 11/24/2015 19:45    Chart has been reviewed  Family   at  Bedside  plan of care was discussed with  Daughter  Alford Highland [phone]  Assessment/Plan 82 year old female with history of metastatic breast cancer in most likely malignant pleural effusion presents with worsening dyspnea and exertional tachycardia with poor by mouth intake acute on chronic renal failure and elevated troponin  Present on Admission:   . Elevated troponin given elevated troponin  and dyspnea and a elderly female will start on heparin, at this point family would like to pursue nonaggressive approach. Not interested in cardiac catheterization. Okay with supportive management and anticoagulation. Given history of breast cancer PE is considered unfortunately secondary to renal insufficiency CTA is not an option. After pleural effusion has been drained can obtain  VQ scan to rule out PE  . Acute on chronic renal failure (HCC) patient appears to be somewhat dehydrated will give gentle fluids  . Pleural effusion discussed with pulmonology will press pursue thoracentesis tomorrow . Hypertension given worsening renal function will continue to hold ARB History of breast cancer. This point family is interested in palliative care consult Prophylaxis: Heparin drip  CODE STATUS:   DNR/DNI as per  family  Disposition:  To home once workup is complete and patient is stable, family prefers, interested in palliative care  Other plan as per orders.  I have spent a total of 63 min on this admission case has been discussed with pulmonology  [place] 11/24/2015, 9:20 PM    [facility]  Pager [phone]   after 2 AM please page floor coverage PA If 7AM-7PM, please contact the day team taking care of the patient  [email or web address]  Password [email or web address]

## 2015-11-24 NOTE — Progress Notes (Signed)
   11/24/15 2243  Vitals  Temp 98.1 F (36.7 C)  Temp Source Oral  BP 134/64 mmHg  BP Location Left Arm  BP Method Automatic  Patient Position (if appropriate) Lying  Pulse Rate 95  Pulse Rate Source Dinamap  ECG Heart Rate 99  Cardiac Rhythm Other (Comment);Junctional rhythm (Accelerated Junctional Rhythm)  Resp (!) 21  Oxygen Therapy  SpO2 98 %  O2 Device Room Air  Height and Weight  Weight 52.3 kg (115 lb 4.8 oz)  ECG Intervals  PR interval 0.11  QRS interval 0.08  QT interval 0.3  QTc interval 0.38   Patient arrived from ED. Patient oriented to room, call bell, and call light. CCMd called and verified. Patient denies pain and reported no hx of falls. Bed alarm on, yellow socks and armband applied d/t confusion.   Ryder Chesmore H Erbie Arment

## 2015-11-24 NOTE — Progress Notes (Signed)
ANTICOAGULATION CONSULT NOTE - Follow Up Consult  Pharmacy Consult for Heparin Indication: pulmonary embolus  No Known Allergies  Patient Measurements:    Wt Readings from Last 3 Encounters:  11/04/15 113 lb 9.6 oz (51.529 kg)  10/12/11 123 lb 3.8 oz (55.9 kg)  07/03/11 118 lb (53.524 kg)    Vital Signs: Temp: 97.8 F (36.6 C) (01/01 1757) Temp Source: Oral (01/01 1757) BP: 123/77 mmHg (01/01 1945) Pulse Rate: 96 (01/01 1945)  Labs:  Recent Labs  11/24/15 1804  HGB 10.0*  HCT 30.5*  PLT 148*  CREATININE 1.96*  TROPONINI 2.78*    CrCl cannot be calculated (Unknown ideal weight.).   Medications:  Infusions:   Assessment: 82 year old female to begin anticoagulation with Heparin for pulmonary embolus.  Note she is currently undergoing breast cancer treatment and was scheduled for thoracentesis this week for pleural effusion.  Will use [place] nomogram to guide initial heparin dosing.  Goal of Therapy:  Heparin level 0.3-0.7 units/ml Monitor platelets by anticoagulation protocol: Yes   Plan:  Heparin 1500 units IV bolus Start Heparin infusion at 700 units/hr Check heparin level in 8 hours Daily heparin level and CBC  Legrand Como, Pharm.D., BCPS, AAHIVP Clinical Pharmacist Phone: [phone] or [phone] 11/24/2015, 8:24 PM

## 2015-11-24 NOTE — ED Notes (Signed)
Pt brought by daughters for inability to ambulate across the room today without feeling SOB. She denies pain now but told her daughter her chest felt tight last night. She had a fever yesterday. She is alert and breathing easily right now. She does have dementia, daughter states pt has been more sleepy over past week and half and  She is currently under treatment by [facility] for breast CA of right breast. She is also scheduled for thoracentesis for pleural effusion this week.

## 2015-11-24 NOTE — ED Notes (Signed)
Dr. Venora Maples MD at bedside.

## 2015-11-24 NOTE — ED Provider Notes (Addendum)
CSN: [id number]     Arrival date & time 11/24/15  1745 History   First MD Initiated Contact with Patient 11/24/15 1845     Chief Complaint  Patient presents with  . Shortness of Breath      HPI Patient presents to the emergency department with her daughter.  Reported that the patient is having significant shortness of breath with even minimal exertion.  As reported that the patient 20 minutes to go from the house to the car secondary to severe shortness of breath.  No fevers or chills.  No productive cough.  She does have a small known right pleural effusion.  The daughter.  This is scheduled for thoracentesis in the next several days for diagnostic reasons.  She is managed at [facility] for her active metastatic breast cancer.   Past Medical History  Diagnosis Date  . History of conization of cervix   . Hypertension   . Hypothyroidism   . Cancer ([place]) rt breast cancer 2004  . Breast cancer ([place])   . Hearing deficit   . Cognitive impairment   . Pleural effusion    Past Surgical History  Procedure Laterality Date  . Appendectomy  2011  . Cataract extraction, bilateral  2010    left eye  . Breast lumpectomy  2005    right  . Breast biopsy  07/09/2011    Procedure: BREAST BIOPSY;  Surgeon: Haywood Lasso, MD;  Location: [facility];  Service: General;  Laterality: Right;  preop diagnosis abnormal calcifications of right breast.  . Pars plana vitrectomy  10/21/2011    Procedure: PARS PLANA VITRECTOMY WITH 23 GAUGE;  Surgeon: Adonis Brook;  Location: [facility];  Service: Ophthalmology;  Laterality: Left;  Intraocular Lens Exchange  . Cataract extraction    . Dilatation & curettage/hysteroscopy with myosure     Family History  Problem Relation Age of Onset  . Prostate cancer Father   . Nephrotic syndrome Sister   . Breast cancer Sister   . Breast cancer Mother   . Pancreatic cancer Sister    Social History  Substance Use Topics  . Smoking status: Former Research scientist (life sciences)  . Smokeless tobacco: None   . Alcohol Use: No     Comment: "wine once or twice a month"   OB History    No data available     Review of Systems  All other systems reviewed and are negative.     Allergies  Review of patient's allergies indicates no known allergies.  Home Medications   Prior to Admission medications   Medication Sig Start Date End Date Taking? Authorizing Provider  aspirin 81 MG tablet Take 81 mg by mouth daily.    Yes Historical Provider, MD  cholecalciferol (VITAMIN D) 400 UNITS TABS Take 1,000 Units by mouth daily.    Yes Historical Provider, MD  docusate sodium (COLACE) 100 MG capsule Take 100 mg by mouth daily.   Yes Historical Provider, MD  levothyroxine (SYNTHROID, LEVOTHROID) 75 MCG tablet Take 75 mcg by mouth daily before breakfast.   Yes Historical Provider, MD  losartan (COZAAR) 50 MG tablet Take 50 mg by mouth daily. 09/07/15  Yes Historical Provider, MD  Lutein-Zeaxanthin 25-5 MG CAPS Take 1 capsule by mouth daily.   Yes Historical Provider, MD  megestrol (MEGACE) 40 MG tablet Take 40 mg by mouth 4 (four) times daily. 10/26/15  Yes Historical Provider, MD  Multiple Vitamin (MULTIVITAMIN WITH MINERALS) TABS tablet Take 1 tablet by mouth daily.   Yes  Historical Provider, MD  HYDROcodone-acetaminophen (NORCO/VICODIN) 5-325 MG per tablet Take 1-2 tablets by mouth every 4 (four) hours as needed. Patient not taking: Reported on 11/04/2015 03/29/14   Lacretia Leigh, MD   BP 123/77 mmHg  Pulse 96  Temp(Src) 97.8 F (36.6 C) (Oral)  Resp 18  SpO2 98% Physical Exam  Constitutional: She is oriented to person, place, and time. She appears well-developed and well-nourished. No distress.  HENT:  Head: Normocephalic and atraumatic.  Eyes: EOM are normal.  Neck: Normal range of motion.  Cardiovascular: Normal rate, regular rhythm and normal heart sounds.   Pulmonary/Chest: Effort normal and breath sounds normal.  Abdominal: Soft. She exhibits no distension. There is no tenderness.   Musculoskeletal: Normal range of motion. She exhibits no edema or tenderness.  Neurological: She is alert and oriented to person, place, and time.  Skin: Skin is warm and dry.  Psychiatric: She has a normal mood and affect. Judgment normal.  Nursing note and vitals reviewed.   ED Course  Procedures (including critical care time)  CRITICAL CARE Performed by: Hoy Morn Total critical care time: 33 minutes Critical care time was exclusive of separately billable procedures and treating other patients. Critical care was necessary to treat or prevent imminent or life-threatening deterioration. Critical care was time spent personally by me on the following activities: development of treatment plan with patient and/or surrogate as well as nursing, discussions with consultants, evaluation of patient's response to treatment, examination of patient, obtaining history from patient or surrogate, ordering and performing treatments and interventions, ordering and review of laboratory studies, ordering and review of radiographic studies, pulse oximetry and re-evaluation of patient's condition.   Labs Review Labs Reviewed  BASIC METABOLIC PANEL - Abnormal; Notable for the following:    Chloride 114 (*)    CO2 16 (*)    Glucose, Bld 229 (*)    BUN 22 (*)    Creatinine, Ser 1.96 (*)    GFR calc non Af Amer 21 (*)    GFR calc Af Amer 24 (*)    All other components within normal limits  CBC - Abnormal; Notable for the following:    WBC 10.6 (*)    Hemoglobin 10.0 (*)    HCT 30.5 (*)    MCV 73.5 (*)    MCH 24.1 (*)    RDW 17.0 (*)    Platelets 148 (*)    All other components within normal limits  BRAIN NATRIURETIC PEPTIDE - Abnormal; Notable for the following:    B Natriuretic Peptide 989.8 (*)    All other components within normal limits  TROPONIN I - Abnormal; Notable for the following:    Troponin I 2.78 (*)    All other components within normal limits  I-STAT TROPOININ, ED -  Abnormal; Notable for the following:    Troponin i, poc 1.70 (*)    All other components within normal limits  TROPONIN I  TROPONIN I  TROPONIN I  LACTIC ACID, PLASMA  HEPARIN LEVEL (UNFRACTIONATED)  CBC    Imaging Review Dg Chest 2 View  11/24/2015  CLINICAL DATA:  Shortness of breath and chest tightness for 2 days. EXAM: CHEST  2 VIEW COMPARISON:  10/13/2015 FINDINGS: Two-view exam again shows small right pleural effusion with right parahilar and infrahilar ill-defined opacity. Prominence of the minor fissure again noted. Left lung is clear. The cardiopericardial silhouette is within normal limits for size. The visualized bony structures of the thorax are intact. IMPRESSION: Stable exam. Small  right pleural effusion with right parahilar and infrahilar opacity. Unilateral pleural effusion can be a sign of malignancy and close follow-up recommended. Electronically Signed   By: Misty Stanley M.D.   On: 11/24/2015 18:23   Ct Chest Wo Contrast  11/24/2015  CLINICAL DATA:  Shortness of breath. Right pleural effusion. The patient is reportedly being treated for breast cancer at [facility]. EXAM: CT CHEST WITHOUT CONTRAST TECHNIQUE: Multidetector CT imaging of the chest was performed following the standard protocol without IV contrast. COMPARISON:  Chest x-rays dated 11/24/2015 and 10/13/2015 and 10/12/2011 FINDINGS: There is a large right pleural effusion. There is a partially calcified soft tissue mass on the right hemidiaphragm. This could represent scar tissue or treated tumor. The patient also has multiple calcified lymph nodes in the right hilum. There is a 21 mm masslike density in the lateral aspect of the right breast underlying the surgical scar. Heart size is normal. There is slight atelectasis or scarring in the lingula and multiple lesions in the thoracic spine, most likely representing metastatic breast cancer. The patient has fairly severe glenohumeral joint arthritis bilaterally. Old compression  fracture of T8. IMPRESSION: 1. Chronic large right pleural effusion. 2. Multiple small bone lesions in the thoracic spine which could represent breast cancer metastases. 3. Small focal area of atelectasis or scarring in the lingula. 4. Soft tissue density in the lateral aspect of the right breast probably represents postsurgical change although I cannot exclude a mass. No prior studies of that area for comparison. Electronically Signed   By: Lorriane Shire M.D.   On: 11/24/2015 19:45   I have personally reviewed and evaluated these images and lab results as part of my medical decision-making.   EKG Interpretation   Date/Time:  Sunday November 24 2015 17:59:13 EST Ventricular Rate:  104 PR Interval:  130 QRS Duration: 70 QT Interval:  348 QTC Calculation: 457 R Axis:   58 Text Interpretation:  Sinus tachycardia with Premature atrial complexes  Nonspecific ST and T wave abnormality Abnormal ECG No significant change  was found Confirmed by Silvia Markuson  MD, Lennette Bihari ([id number]) on 11/24/2015 6:50:21 PM      MDM   Final diagnoses:  Dyspnea  Pleural effusion  Renal insufficiency    Given her significant exertional shortness of breath and tachycardia at a high suspicion for pulmonary embolism given her history of active cancer.  No isolated unilateral leg swelling.  Patient is unable to receive CT injury of her chest secondary to renal insufficiency.  Unable to get a VQ scan at this time although even if her VQ scan were able to be completed at this time it would likely be difficult to read given the large right-sided pleural effusion.  Her pleural effusion to different be making her dyspneic however her troponin is elevated and that would be rare to be caused by an isolated large pleural effusion.  The patient will benefit from echocardiogram in the morning as well as thoracentesis of the right lung.  No active chest pain this time.  EKG is nonischemic.    Jola Schmidt, MD 11/24/15 2036  Jola Schmidt,  MD 11/24/15 2037

## 2015-11-24 NOTE — ED Notes (Signed)
Lab reports critical troponin of 2.78. MD notified.

## 2015-11-24 NOTE — ED Notes (Signed)
Main lab called reporting critical Lactic Acid level of 2.8. MD notified and acknowledges.

## 2015-11-24 NOTE — ED Notes (Signed)
Explained to family that patient needs to stay in bed and use bedpan. Family acknowleges.

## 2015-11-25 ENCOUNTER — Inpatient Hospital Stay (HOSPITAL_COMMUNITY): Payer: Medicare Other

## 2015-11-25 DIAGNOSIS — I82409 Acute embolism and thrombosis of unspecified deep veins of unspecified lower extremity: Secondary | ICD-10-CM

## 2015-11-25 DIAGNOSIS — R0600 Dyspnea, unspecified: Secondary | ICD-10-CM

## 2015-11-25 DIAGNOSIS — I824Z9 Acute embolism and thrombosis of unspecified deep veins of unspecified distal lower extremity: Secondary | ICD-10-CM

## 2015-11-25 DIAGNOSIS — J90 Pleural effusion, not elsewhere classified: Secondary | ICD-10-CM

## 2015-11-25 LAB — CBC
HEMATOCRIT: 29.9 % — AB (ref 36.0–46.0)
HEMOGLOBIN: 9.7 g/dL — AB (ref 12.0–15.0)
MCH: 23.6 pg — ABNORMAL LOW (ref 26.0–34.0)
MCHC: 32.4 g/dL (ref 30.0–36.0)
MCV: 72.7 fL — ABNORMAL LOW (ref 78.0–100.0)
Platelets: 137 10*3/uL — ABNORMAL LOW (ref 150–400)
RBC: 4.11 MIL/uL (ref 3.87–5.11)
RDW: 16.6 % — ABNORMAL HIGH (ref 11.5–15.5)
WBC: 10.4 10*3/uL (ref 4.0–10.5)

## 2015-11-25 LAB — URINALYSIS, ROUTINE W REFLEX MICROSCOPIC
BILIRUBIN URINE: NEGATIVE
GLUCOSE, UA: NEGATIVE mg/dL
Ketones, ur: NEGATIVE mg/dL
Nitrite: NEGATIVE
PH: 5 (ref 5.0–8.0)
Protein, ur: NEGATIVE mg/dL
SPECIFIC GRAVITY, URINE: 1.02 (ref 1.005–1.030)

## 2015-11-25 LAB — BODY FLUID CELL COUNT WITH DIFFERENTIAL
Eos, Fluid: 0 %
LYMPHS FL: 30 %
MONOCYTE-MACROPHAGE-SEROUS FLUID: 67 % (ref 50–90)
Neutrophil Count, Fluid: 3 % (ref 0–25)
WBC FLUID: 450 uL (ref 0–1000)

## 2015-11-25 LAB — COMPREHENSIVE METABOLIC PANEL
ALBUMIN: 2.8 g/dL — AB (ref 3.5–5.0)
ALK PHOS: 55 U/L (ref 38–126)
ALT: 26 U/L (ref 14–54)
ANION GAP: 10 (ref 5–15)
AST: 43 U/L — AB (ref 15–41)
BILIRUBIN TOTAL: 0.5 mg/dL (ref 0.3–1.2)
BUN: 21 mg/dL — AB (ref 6–20)
CALCIUM: 9.2 mg/dL (ref 8.9–10.3)
CO2: 19 mmol/L — AB (ref 22–32)
Chloride: 115 mmol/L — ABNORMAL HIGH (ref 101–111)
Creatinine, Ser: 1.54 mg/dL — ABNORMAL HIGH (ref 0.44–1.00)
GFR calc Af Amer: 33 mL/min — ABNORMAL LOW (ref >60)
GFR calc non Af Amer: 28 mL/min — ABNORMAL LOW (ref >60)
GLUCOSE: 115 mg/dL — AB (ref 65–99)
Potassium: 4.5 mmol/L (ref 3.5–5.1)
SODIUM: 144 mmol/L (ref 135–145)
Total Protein: 6.5 g/dL (ref 6.5–8.1)

## 2015-11-25 LAB — HEPARIN LEVEL (UNFRACTIONATED)
HEPARIN UNFRACTIONATED: 0.16 [IU]/mL — AB (ref 0.30–0.70)
Heparin Unfractionated: 0.45 IU/mL (ref 0.30–0.70)

## 2015-11-25 LAB — URINE MICROSCOPIC-ADD ON

## 2015-11-25 LAB — PROTEIN, TOTAL: Total Protein: 5.9 g/dL — ABNORMAL LOW (ref 6.5–8.1)

## 2015-11-25 LAB — PROTEIN, BODY FLUID: Total protein, fluid: 3.9 g/dL

## 2015-11-25 LAB — GLUCOSE, SEROUS FLUID: Glucose, Fluid: 83 mg/dL

## 2015-11-25 LAB — LACTATE DEHYDROGENASE, PLEURAL OR PERITONEAL FLUID: LD FL: 225 U/L — AB (ref 3–23)

## 2015-11-25 LAB — PHOSPHORUS: Phosphorus: 3.5 mg/dL (ref 2.5–4.6)

## 2015-11-25 LAB — TROPONIN I
TROPONIN I: 2.59 ng/mL — AB (ref <0.031)
Troponin I: 2.34 ng/mL (ref <0.031)

## 2015-11-25 LAB — MAGNESIUM: Magnesium: 2 mg/dL (ref 1.7–2.4)

## 2015-11-25 LAB — LACTATE DEHYDROGENASE: LDH: 278 U/L — ABNORMAL HIGH (ref 98–192)

## 2015-11-25 MED ORDER — ENSURE ENLIVE PO LIQD: 237.0000 mL | Freq: Two times a day (BID) | ORAL | Status: DC

## 2015-11-25 NOTE — Progress Notes (Signed)
Initial Nutrition Assessment  DOCUMENTATION CODES:   Not applicable  INTERVENTION:   -RD will follow for diet advancement and supplement diet as appropriate  NUTRITION DIAGNOSIS:   Inadequate oral intake related to inability to eat as evidenced by NPO status.  GOAL:   Patient will meet greater than or equal to 90% of their needs  MONITOR:   Diet advancement, Labs, Weight trends, Skin, I & O's  REASON FOR ASSESSMENT:   Malnutrition Screening Tool, Consult Poor PO  ASSESSMENT:   82 year old female with history of metastatic breast cancer in most likely malignant pleural effusion presents with worsening dyspnea and exertional tachycardia with poor by mouth intake acute on chronic renal failure and elevated troponin  Pt admitted with elevated troponin.   Attempted to evaluate pt x 3, however, pt either receiving nursing care or bathing at time of visit. Per chart review, pt has been very disoriented and pulling out IVs. Unable to complete Nutrition-Focused physical exam at this time.   Pt has had poor po intake PTA; noted pt was started on Megace at home to assist with stimulation of appetite. Pt is currently NPO; SLP evaluation pending.   Wt hx reviewed. Wt has been stable over the past month. Noted mild wt loss > 1 year ago. No weight changes significant for time frame.   Pt family requesting a palliative care consult, which is pending. If pt chooses a palliative care/comfort-based approach, suspect continued nutritional decline.   Labs reviewed.   Diet Order:  Diet NPO time specified  Skin:  Reviewed, no issues  Last BM:  11/23/15  Height:   Ht Readings from Last 1 Encounters:  11/24/15 5' 0.98" (1.549 m)    Weight:   Wt Readings from Last 1 Encounters:  11/24/15 115 lb 4.8 oz (52.3 kg)    Ideal Body Weight:  45.5 kg  BMI:  Body mass index is 21.8 kg/(m^2).  Estimated Nutritional Needs:   Kcal:  1200-1400  Protein:  55-65 grams  Fluid:  1.2-1.4  L  EDUCATION NEEDS:   No education needs identified at this time  Juquan Reznick A. Jimmye Norman, RD, LDN, CDE Pager: [phone] After hours Pager: [phone]

## 2015-11-25 NOTE — Consult Note (Signed)
PCCM PROGRESS NOTE  ADMISSION DATE: 11/24/2015 CONSULT DATE: 11/25/2015 REFERRING PROVIDER: Dr. Eliseo Squires  CC: Short of breath  HISTORY OF PRESENT ILLNESS: 82 yo female with hx of metastatic breast cancer presented with tachycardia.  She was found to have Rt pleural effusion.  This has been present since at least November 2016.  She has not had prior thoracentesis before, but was scheduled to have one as outpt.  She is followed at [facility] for her breast cancer.  Bone scan from 10/04/15 showed metastatic bone lesions.  MRI brain from 10/07/15 did not show mets.  She has been getting more short of breath for the past several months.  She denies fever or sputum.  She denies hemoptysis.  There is no prior hx of pneumonia or exposure to tuberculosis.  She has poor appetite, and has lost about 15 lbs over the past several months.  PAST MEDICAL HISTORY: She  has a past medical history of History of conization of cervix; Hypertension; Hypothyroidism; Cancer [facility]) (rt breast cancer 2004); Breast cancer ([place]); Hearing deficit; Cognitive impairment; and Pleural effusion.  PAST SURGICAL HISTORY: She  has past surgical history that includes Appendectomy (2011); Cataract extraction, bilateral (2010); Breast lumpectomy (2005); Breast biopsy (07/09/2011); Pars plana vitrectomy (10/21/2011); Cataract extraction; and Dilatation & curettage/hysteroscopy with myosure.  FAMILY HISTORY: Her family history includes Breast cancer in her mother and sister; Nephrotic syndrome in her sister; Pancreatic cancer in her sister; Prostate cancer in her father.  SOCIAL HISTORY: She  reports that she has quit smoking. She does not have any smokeless tobacco history on file. She reports that she does not drink alcohol or use illicit drugs.  No Known Allergies  No current facility-administered medications on file prior to encounter.   Current Outpatient Prescriptions on File Prior to Encounter  Medication Sig  . aspirin 81 MG tablet  Take 81 mg by mouth daily.   . cholecalciferol (VITAMIN D) 400 UNITS TABS Take 1,000 Units by mouth daily.   Marland Kitchen docusate sodium (COLACE) 100 MG capsule Take 100 mg by mouth daily.  Marland Kitchen levothyroxine (SYNTHROID, LEVOTHROID) 75 MCG tablet Take 75 mcg by mouth daily before breakfast.  . losartan (COZAAR) 50 MG tablet Take 50 mg by mouth daily.  . Multiple Vitamin (MULTIVITAMIN WITH MINERALS) TABS tablet Take 1 tablet by mouth daily.  Marland Kitchen HYDROcodone-acetaminophen (NORCO/VICODIN) 5-325 MG per tablet Take 1-2 tablets by mouth every 4 (four) hours as needed. (Patient not taking: Reported on 11/04/2015)    REVIEW OF SYSTEMS: Negative except above  SUBJECTIVE: Feels cold.  OBJECTIVE: BP 132/68 mmHg  Pulse 97  Temp(Src) 97.4 F (36.3 C) (Oral)  Resp 19  Ht 5' 0.98" (1.549 m)  Wt 115 lb 4.8 oz (52.3 kg)  BMI 21.80 kg/m2  SpO2 98%  I/O last 3 completed shifts: In: -  Out: 130 [Urine:130]  General: thin HEENT: temporal wasting Cardiac: regular, no murmur Chest: decreased BS with dullness to percussion at Rt base Abd: thin, soft, non tender Ext: no edema, decreased muscle bulk Neuro: pleasant, alert, normal strength Skin: no rashes   CMP Latest Ref Rng 11/25/2015 11/24/2015 10/13/2015  Glucose 65 - 99 mg/dL 115(H) 229(H) 164(H)  BUN 6 - 20 mg/dL 21(H) 22(H) 20  Creatinine 0.44 - 1.00 mg/dL 1.54(H) 1.96(H) 1.54(H)  Sodium 135 - 145 mmol/L 144 142 143  Potassium 3.5 - 5.1 mmol/L 4.5 4.4 4.0  Chloride 101 - 111 mmol/L 115(H) 114(H) 112(H)  CO2 22 - 32 mmol/L 19(L) 16(L) 23  Calcium  8.9 - 10.3 mg/dL 9.2 9.3 8.9  Total Protein 6.5 - 8.1 g/dL 6.5 - 7.1  Total Bilirubin 0.3 - 1.2 mg/dL 0.5 - 0.4  Alkaline Phos 38 - 126 U/L 55 - 46  AST 15 - 41 U/L 43(H) - 27  ALT 14 - 54 U/L 26 - 21     CBC Latest Ref Rng 11/25/2015 11/24/2015 10/13/2015  WBC 4.0 - 10.5 K/uL 10.4 10.6(H) 11.3(H)  Hemoglobin 12.0 - 15.0 g/dL 9.7(L) 10.0(L) 10.1(L)  Hematocrit 36.0 - 46.0 % 29.9(L) 30.5(L) 31.6(L)   Platelets 150 - 400 K/uL 137(L) 148(L) 230     Dg Chest 2 View  11/24/2015  CLINICAL DATA:  Shortness of breath and chest tightness for 2 days. EXAM: CHEST  2 VIEW COMPARISON:  10/13/2015 FINDINGS: Two-view exam again shows small right pleural effusion with right parahilar and infrahilar ill-defined opacity. Prominence of the minor fissure again noted. Left lung is clear. The cardiopericardial silhouette is within normal limits for size. The visualized bony structures of the thorax are intact. IMPRESSION: Stable exam. Small right pleural effusion with right parahilar and infrahilar opacity. Unilateral pleural effusion can be a sign of malignancy and close follow-up recommended. Electronically Signed   By: Misty Stanley M.D.   On: 11/24/2015 18:23   Ct Chest Wo Contrast  11/24/2015  CLINICAL DATA:  Shortness of breath. Right pleural effusion. The patient is reportedly being treated for breast cancer at [facility]. EXAM: CT CHEST WITHOUT CONTRAST TECHNIQUE: Multidetector CT imaging of the chest was performed following the standard protocol without IV contrast. COMPARISON:  Chest x-rays dated 11/24/2015 and 10/13/2015 and 10/12/2011 FINDINGS: There is a large right pleural effusion. There is a partially calcified soft tissue mass on the right hemidiaphragm. This could represent scar tissue or treated tumor. The patient also has multiple calcified lymph nodes in the right hilum. There is a 21 mm masslike density in the lateral aspect of the right breast underlying the surgical scar. Heart size is normal. There is slight atelectasis or scarring in the lingula and multiple lesions in the thoracic spine, most likely representing metastatic breast cancer. The patient has fairly severe glenohumeral joint arthritis bilaterally. Old compression fracture of T8. IMPRESSION: 1. Chronic large right pleural effusion. 2. Multiple small bone lesions in the thoracic spine which could represent breast cancer metastases. 3. Small  focal area of atelectasis or scarring in the lingula. 4. Soft tissue density in the lateral aspect of the right breast probably represents postsurgical change although I cannot exclude a mass. No prior studies of that area for comparison. Electronically Signed   By: Lorriane Shire M.D.   On: 11/24/2015 19:45     CULTURES:  STUDIES: 01/01 CT chest >> large Rt effusion 01/02 Echo >>  EVENTS: 01/01 Admit  DISCUSSION: 82 yo female with persistent Rt pleural effusion in setting of metastatic breast cancer.  ASSESSMENT/PLAN:  Right pleural effusion. Plan: - will proceed with thoracentesis >> risks detailed as bleeding, infection, pneumothorax, and non diagnosis - will send pleural fluid for analysis, including cytology - will need to hold heparin for 6 hours prior to proceeding with thoracentesis >> turned off at 1pm on 11/25/15  Updated pt's daughter at bedside.  D/w Dr. Eliseo Squires.  Chesley Mires, MD [facility] Pulmonary/Critical Care 11/25/2015, 12:42 PM Pager:  [phone] After 3pm call: [phone]

## 2015-11-25 NOTE — Progress Notes (Signed)
Patient woke up confused this morning. She removed her IV, oxygen, and armbands. She mentioned not knowing where she is or what's happening. Patient reoriented to situation and location. Daughter arrived and help reorient pt and get her back to bed.  Started new IV access. Heparin and fluids are on. Oxygen and armband reapplied. No complains at this time. Will continue to monitor closely.  Holly Patterson Holly Patterson

## 2015-11-25 NOTE — Procedures (Signed)
Thoracentesis Procedure Note  Pre-operative Diagnosis: Rt effusion  Post-operative Diagnosis: same  Indications: metastatic breast CA, rt effusion  Procedure Details  Consent: Informed consent was obtained. Risks of the procedure were discussed including: infection, bleeding, pain, pneumothorax.  Bedside US >> large amount of right effusion noted, point marked for thora  Under sterile conditions the patient was positioned. Betadine solution and sterile drapes were utilized.  2% buffered lidocaine was used to anesthetize the 7th rib space. Fluid was obtained without any difficulties and minimal blood loss.  A dressing was applied to the wound and wound care instructions were provided.   Findings 1300 ml of amber yellow pleural fluid was obtained. A sample was sent to Pathology for cytology, and cell counts, as well as for infection analysis.  Complications:  None; patient tolerated the procedure well.          Condition: stable  Plan A follow up chest x-ray was ordered. Bed Rest for 2 hours. Tylenol 650 mg. for pain. Resume heparin if CXR ok  Attending Attestation: I performed the procedure.  Rigoberto Noel MD

## 2015-11-25 NOTE — Progress Notes (Signed)
ANTICOAGULATION CONSULT NOTE - Follow Up Consult  Pharmacy Consult for Heparin Indication: pulmonary embolus  No Known Allergies  Patient Measurements: Weight: 115 lb 4.8 oz (52.3 kg)  Wt Readings from Last 3 Encounters:  11/24/15 115 lb 4.8 oz (52.3 kg)  11/04/15 113 lb 9.6 oz (51.529 kg)  10/12/11 123 lb 3.8 oz (55.9 kg)    Vital Signs: Temp: 97.4 F (36.3 C) (01/02 0208) Temp Source: Oral (01/02 0208) BP: 132/68 mmHg (01/02 0208) Pulse Rate: 97 (01/02 0208)  Labs:  Recent Labs  11/24/15 1804 11/24/15 2103 11/24/15 2300 11/25/15 0347  HGB 10.0*  --   --  9.7*  HCT 30.5*  --   --  29.9*  PLT 148*  --   --  137*  HEPARINUNFRC  --   --   --  0.16*  CREATININE 1.96*  --   --   --   TROPONINI 2.78* 2.34* 2.59*  --     Estimated Creatinine Clearance: 13.8 mL/min (by C-G formula based on Cr of 1.96).   Medications:  Infusions:   Assessment: 82 year old female started on IV Heparin for pulmonary embolus.  Note she is currently undergoing breast cancer treatment and was scheduled for thoracentesis this week for pleural effusion.  Per RN, patient pulled out IV twice over night which could be cause of low heparin level. CBC low stable  Goal of Therapy:  Heparin level 0.3-0.7 units/ml Monitor platelets by anticoagulation protocol: Yes   Plan:  Continue Heparin infusion at 700 units/hr Check heparin level in 6 hours Daily heparin level and CBC  Albertina Parr, PharmD., BCPS Clinical Pharmacist Pager [phone]

## 2015-11-25 NOTE — Progress Notes (Signed)
ANTICOAGULATION CONSULT NOTE - Follow Up Consult  Pharmacy Consult for Heparin Indication: pulmonary embolus  No Known Allergies  Patient Measurements: Height: 5' 0.98" (154.9 cm) Weight: 115 lb 4.8 oz (52.3 kg) IBW/kg (Calculated) : 47.76  Wt Readings from Last 3 Encounters:  11/24/15 115 lb 4.8 oz (52.3 kg)  11/04/15 113 lb 9.6 oz (51.529 kg)  10/12/11 123 lb 3.8 oz (55.9 kg)    Vital Signs: Temp: 97.4 F (36.3 C) (01/02 0208) Temp Source: Oral (01/02 0208) BP: 132/68 mmHg (01/02 0208) Pulse Rate: 97 (01/02 0208)  Labs:  Recent Labs  11/24/15 1804 11/24/15 2103 11/24/15 2300 11/25/15 0347 11/25/15 1150  HGB 10.0*  --   --  9.7*  --   HCT 30.5*  --   --  29.9*  --   PLT 148*  --   --  137*  --   HEPARINUNFRC  --   --   --  0.16* 0.45  CREATININE 1.96*  --   --  1.54*  --   TROPONINI 2.78* 2.34* 2.59* 2.34*  --     Estimated Creatinine Clearance: 17.6 mL/min (by C-G formula based on Cr of 1.54).  Assessment: 82 year old female started on IV Heparin for r/o pulmonary embolus.  Note she is currently undergoing breast cancer treatment and was scheduled for thoracentesis this week for pleural effusion. Initial heparin level was inaccurate since IV was removed. Another heparin level was checked and is not therapeutic at 0.45. No bleeding ntoed.   Goal of Therapy:  Heparin level 0.3-0.7 units/ml Monitor platelets by anticoagulation protocol: Yes   Plan:  Continue Heparin infusion at 700 units/hr Check a confirmatory heparin level tonight Daily heparin level and CBC  Salome Arnt, PharmD, BCPS Pager # [phone] 11/25/2015 12:33 PM

## 2015-11-25 NOTE — Progress Notes (Signed)
   11/25/15 0208  Vitals  Temp 97.4 F (36.3 C)  Temp Source Oral  BP 132/68 mmHg  MAP (mmHg) 85  BP Location Left Arm  BP Method Automatic  Patient Position (if appropriate) Lying  Pulse Rate 97  Resp 19  Oxygen Therapy  SpO2 98 %  O2 Device Nasal Cannula  O2 Flow Rate (L/min) 2 L/min    Patient woke up confused. Found to be bleeding d/t IV access being pulled out. She converted to AFib w/ 115-120 HR and spontaneously converted back to NSR. EKG completed. Oncall MD made aware. No new order at this time. Will continue to monitor closely. Daughter at bedside.  Kelsen Celona H Masiyah Engen

## 2015-11-25 NOTE — Progress Notes (Signed)
VASCULAR LAB PRELIMINARY  PRELIMINARY  PRELIMINARY  PRELIMINARY Bilateral Lower extremity venous Doppler has been completed.    Preliminary report:  Findings consistent with ACUTE deep vein thrombosis involving the right lower extremity. Distal Femoral Vein extends down to Pop Vein. Non Compressible. Called the RN with results. No evidence of  superficial thrombosis involving the right lower extremity.  No evidence of deep vein or superficial thrombosis involving the left lower extremity and right common femoral vein.  No Baker's cyst Bilaterally.  Janifer Adie, RVT, RDMS 11/25/2015, 2:27 PM

## 2015-11-25 NOTE — Progress Notes (Signed)
UR Completed. Ashad Fawbush, RN, BSN.  [phone] 

## 2015-11-25 NOTE — Progress Notes (Signed)
ANTICOAGULATION CONSULT NOTE - Follow Up Consult  Pharmacy Consult for heparin Indication: pulmonary embolus  No Known Allergies  Patient Measurements: Height: 5' 0.98" (154.9 cm) Weight: 115 lb 4.8 oz (52.3 kg) IBW/kg (Calculated) : 47.76  Vital Signs: Temp: 98.4 F (36.9 C) (01/02 1942) Temp Source: Oral (01/02 1942) BP: 120/65 mmHg (01/02 1942) Pulse Rate: 83 (01/02 1942)  Labs:  Recent Labs  11/24/15 1804 11/24/15 2103 11/24/15 2300 11/25/15 0347 11/25/15 1150  HGB 10.0*  --   --  9.7*  --   HCT 30.5*  --   --  29.9*  --   PLT 148*  --   --  137*  --   HEPARINUNFRC  --   --   --  0.16* 0.45  CREATININE 1.96*  --   --  1.54*  --   TROPONINI 2.78* 2.34* 2.59* 2.34*  --     Estimated Creatinine Clearance: 17.6 mL/min (by C-G formula based on Cr of 1.54).   Assessment: 82 year old female started on IV Heparin for r/o pulmonary embolus. Note she is currently undergoing breast cancer treatment and was scheduled for thoracentesis this week for pleural effusion. Heparin held and thoracentesis done. Follow up CXR looks okay so now to restart heparin.  Hgb low at 9.7, plts 137. No s/s of bleed.  Goal of Therapy:  Heparin level 0.3-0.7 units/ml Monitor platelets by anticoagulation protocol: Yes   Plan:  No heparin BOLUS Restart heparin gtt at 700 units/hr Check 8 hr HL Monitor daily HL, CBC, s/s of bleed

## 2015-11-25 NOTE — Progress Notes (Signed)
  Echocardiogram 2D Echocardiogram has been performed.  Holly Patterson 11/25/2015, 10:45 AM

## 2015-11-25 NOTE — Progress Notes (Signed)
PROGRESS NOTE  Holly Patterson MI [id number] DOB: 10-22-1921 DOA: 11/24/2015 PCP: Patricia Nettle, MD  Assessment/Plan: DVT and presumed PE -heparin and then change to one of the NOACs if Cr ok vs lovenox -echo done -will not pursue V/Q or CTA as will treat DVT  Elevated troponin - family would like to pursue nonaggressive approach. Not interested in cardiac catheterization. Okay with supportive management and anticoagulation. Given history of breast cancer PE is considered unfortunately secondary to renal insufficiency CTA is not an option. Will not pursue V/Q as will treat DVT  Acute on chronic renal failure ([place]) patient appears to be somewhat dehydrated will give gentle fluids   Pleural effusion -pulmonology : thoracentesis  Hypertension given worsening renal function will continue to hold ARB  History of breast cancer. This point family is interested in palliative care consult  Code Status: DNR Family Communication: daughter at bedside Disposition Plan:    Consultants:  pulm  Procedures:      HPI/Subjective: Feeling better  Objective: Filed Vitals:   11/24/15 2243 11/25/15 0208  BP: 134/64 132/68  Pulse: 95 97  Temp: 98.1 F (36.7 C) 97.4 F (36.3 C)  Resp: 21 19    Intake/Output Summary (Last 24 hours) at 11/25/15 1443 Last data filed at 11/25/15 1230  Gross per 24 hour  Intake      0 ml  Output    130 ml  Net   -130 ml   Filed Weights   11/24/15 2243  Weight: 52.3 kg (115 lb 4.8 oz)    Exam:   General:  Pleasant/cooperative  Cardiovascular: rrr  Respiratory: diminished  Abdomen: +BS, soft  Musculoskeletal: min edema   Data Reviewed: Basic Metabolic Panel:  Recent Labs Lab 11/24/15 1804 11/25/15 0347  NA 142 144  K 4.4 4.5  CL 114* 115*  CO2 16* 19*  GLUCOSE 229* 115*  BUN 22* 21*  CREATININE 1.96* 1.54*  CALCIUM 9.3 9.2  MG  --  2.0  PHOS  --  3.5   Liver Function Tests:  Recent Labs Lab 11/25/15 0347  AST  43*  ALT 26  ALKPHOS 55  BILITOT 0.5  PROT 6.5  ALBUMIN 2.8*   No results for input(s): LIPASE, AMYLASE in the last 168 hours. No results for input(s): AMMONIA in the last 168 hours. CBC:  Recent Labs Lab 11/24/15 1804 11/25/15 0347  WBC 10.6* 10.4  HGB 10.0* 9.7*  HCT 30.5* 29.9*  MCV 73.5* 72.7*  PLT 148* 137*   Cardiac Enzymes:  Recent Labs Lab 11/24/15 1804 11/24/15 2103 11/24/15 2300 11/25/15 0347  TROPONINI 2.78* 2.34* 2.59* 2.34*   BNP (last 3 results)  Recent Labs  11/24/15 1804  BNP 989.8*    ProBNP (last 3 results) No results for input(s): PROBNP in the last 8760 hours.  CBG: No results for input(s): GLUCAP in the last 168 hours.  No results found for this or any previous visit (from the past 240 hour(s)).   Studies: Dg Chest 2 View  11/24/2015  CLINICAL DATA:  Shortness of breath and chest tightness for 2 days. EXAM: CHEST  2 VIEW COMPARISON:  10/13/2015 FINDINGS: Two-view exam again shows small right pleural effusion with right parahilar and infrahilar ill-defined opacity. Prominence of the minor fissure again noted. Left lung is clear. The cardiopericardial silhouette is within normal limits for size. The visualized bony structures of the thorax are intact. IMPRESSION: Stable exam. Small right pleural effusion with right parahilar and infrahilar opacity. Unilateral pleural effusion can  be a sign of malignancy and close follow-up recommended. Electronically Signed   By: Misty Stanley M.D.   On: 11/24/2015 18:23   Ct Chest Wo Contrast  11/24/2015  CLINICAL DATA:  Shortness of breath. Right pleural effusion. The patient is reportedly being treated for breast cancer at [facility]. EXAM: CT CHEST WITHOUT CONTRAST TECHNIQUE: Multidetector CT imaging of the chest was performed following the standard protocol without IV contrast. COMPARISON:  Chest x-rays dated 11/24/2015 and 10/13/2015 and 10/12/2011 FINDINGS: There is a large right pleural effusion. There is a  partially calcified soft tissue mass on the right hemidiaphragm. This could represent scar tissue or treated tumor. The patient also has multiple calcified lymph nodes in the right hilum. There is a 21 mm masslike density in the lateral aspect of the right breast underlying the surgical scar. Heart size is normal. There is slight atelectasis or scarring in the lingula and multiple lesions in the thoracic spine, most likely representing metastatic breast cancer. The patient has fairly severe glenohumeral joint arthritis bilaterally. Old compression fracture of T8. IMPRESSION: 1. Chronic large right pleural effusion. 2. Multiple small bone lesions in the thoracic spine which could represent breast cancer metastases. 3. Small focal area of atelectasis or scarring in the lingula. 4. Soft tissue density in the lateral aspect of the right breast probably represents postsurgical change although I cannot exclude a mass. No prior studies of that area for comparison. Electronically Signed   By: Lorriane Shire M.D.   On: 11/24/2015 19:45    Scheduled Meds: . aspirin EC  81 mg Oral Daily  . docusate sodium  100 mg Oral Daily  . levothyroxine  75 mcg Oral QAC breakfast  . megestrol  40 mg Oral QID  . sodium chloride  3 mL Intravenous Q12H   Continuous Infusions: . heparin Stopped (11/25/15 1232)   Antibiotics Given (last 72 hours)    None      Active Problems:   Personal history of malignant neoplasm of breast   Hypertension   Elevated troponin   Acute on chronic renal failure (HCC)   Pleural effusion    Time spent: 25 min    [facility] Pager [phone] If 7PM-7AM, please contact night-coverage at [email or web address], password [facility] 11/25/2015, 2:43 PM  LOS: 1 day

## 2015-11-25 NOTE — Progress Notes (Signed)
SLP Cancellation Note  Patient Details Name: MANISHA CAROL MRN: [id number] DOB: 1921/10/27   Cancelled treatment:       Reason Eval/Treat Not Completed: Medical issues which prohibited therapy (pt appears to be NPO this morning pending procedure - will f/u as able).   Germain Osgood, M.A. CCC-SLP [phone]  Germain Osgood 11/25/2015, 9:54 AM

## 2015-11-26 ENCOUNTER — Ambulatory Visit (HOSPITAL_COMMUNITY): Admission: RE | Admit: 2015-11-26 | Payer: Medicare Other | Source: Ambulatory Visit

## 2015-11-26 ENCOUNTER — Inpatient Hospital Stay (HOSPITAL_COMMUNITY): Payer: Medicare Other

## 2015-11-26 DIAGNOSIS — I82409 Acute embolism and thrombosis of unspecified deep veins of unspecified lower extremity: Secondary | ICD-10-CM

## 2015-11-26 DIAGNOSIS — Z515 Encounter for palliative care: Secondary | ICD-10-CM

## 2015-11-26 LAB — BASIC METABOLIC PANEL
ANION GAP: 8 (ref 5–15)
BUN: 18 mg/dL (ref 6–20)
CALCIUM: 8.3 mg/dL — AB (ref 8.9–10.3)
CO2: 18 mmol/L — ABNORMAL LOW (ref 22–32)
Chloride: 116 mmol/L — ABNORMAL HIGH (ref 101–111)
Creatinine, Ser: 1.65 mg/dL — ABNORMAL HIGH (ref 0.44–1.00)
GFR, EST AFRICAN AMERICAN: 30 mL/min — AB (ref >60)
GFR, EST NON AFRICAN AMERICAN: 26 mL/min — AB (ref >60)
GLUCOSE: 84 mg/dL (ref 65–99)
Potassium: 3.9 mmol/L (ref 3.5–5.1)
Sodium: 142 mmol/L (ref 135–145)

## 2015-11-26 LAB — CBC
HCT: 30.5 % — ABNORMAL LOW (ref 36.0–46.0)
HEMOGLOBIN: 10.3 g/dL — AB (ref 12.0–15.0)
MCH: 24.5 pg — ABNORMAL LOW (ref 26.0–34.0)
MCHC: 33.8 g/dL (ref 30.0–36.0)
MCV: 72.4 fL — ABNORMAL LOW (ref 78.0–100.0)
PLATELETS: 131 10*3/uL — AB (ref 150–400)
RBC: 4.21 MIL/uL (ref 3.87–5.11)
RDW: 16.8 % — ABNORMAL HIGH (ref 11.5–15.5)
WBC: 10.5 10*3/uL (ref 4.0–10.5)

## 2015-11-26 LAB — HEPARIN LEVEL (UNFRACTIONATED): Heparin Unfractionated: 0.43 IU/mL (ref 0.30–0.70)

## 2015-11-26 LAB — PROTIME-INR
INR: 1.4 (ref 0.00–1.49)
Prothrombin Time: 17.3 seconds — ABNORMAL HIGH (ref 11.6–15.2)

## 2015-11-26 MED ORDER — COUMADIN BOOK
Freq: Once | Status: AC
  Administered 2015-11-26: 18:00:00
  Filled 2015-11-26: qty 1

## 2015-11-26 MED ORDER — WARFARIN - PHARMACIST DOSING INPATIENT: Freq: Every day | Status: DC

## 2015-11-26 MED ORDER — ENSURE ENLIVE PO LIQD
237.0000 mL | Freq: Two times a day (BID) | ORAL | Status: DC
  Administered 2015-11-26 – 2015-11-27 (×3): 237 mL via ORAL

## 2015-11-26 MED ORDER — ENOXAPARIN SODIUM 60 MG/0.6ML ~~LOC~~ SOLN
50.0000 mg | SUBCUTANEOUS | Status: DC
  Administered 2015-11-26 – 2015-11-27 (×2): 50 mg via SUBCUTANEOUS
  Filled 2015-11-26 (×2): qty 0.6

## 2015-11-26 MED ORDER — WARFARIN SODIUM 2.5 MG PO TABS
2.5000 mg | ORAL_TABLET | Freq: Once | ORAL | Status: AC
  Administered 2015-11-26: 2.5 mg via ORAL
  Filled 2015-11-26: qty 1

## 2015-11-26 NOTE — Care Management Note (Addendum)
Case Management Note  Patient Details  Name: Holly Patterson MRN: [id number] Date of Birth: 09/13/1921  Subjective/Objective:     Pt admitted with Malignant Pleural Effusions               Action/Plan:  Pt is independent from home.  Pt will discharge home with hospice, pt will have care provided by family (mostly daughter in law [place]) for 24 hours a day.  Pts daughter [name] will also provide support for pt.  Pt will sleep on main level of home (guest room) as the bed is closer to the floor.   Expected Discharge Date:                  Expected Discharge Plan:  Home w Hospice Care  In-House Referral:     Discharge planning Services  CM Consult  Post Acute Care Choice:  Durable Medical Equipment Choice offered to:  Patient  DME Arranged:    DME Agency:   ([place])  HH Arranged:  RN [facility] Agency:   ([place])  Status of Service:  In process, will continue to follow  Medicare Important Message Given:    Date Medicare IM Given:    Medicare IM give by:    Date Additional Medicare IM Given:    Additional Medicare Important Message give by:     If discussed at [place] of Stay Meetings, dates discussed:    Additional Comments: Requested information faxed at 11:42  CM assessed pt; Pt is very pleasant, daughter [name] at bedside.  Pt was offered choice of [place], pt/daughter chose [facility].  CM contacted agency and provided referral to [facility].  CM will fax requested documentation to agency at [phone].  Per Lovena Le; agency will send RN to talk with pt and daughter at bedside today and follow up with any needs with CM.  CM requested PT evaluation and nutritional consult prior to discharge per daughter request. Maryclare Labrador, RN 11/26/2015, 11:33 AM

## 2015-11-26 NOTE — Discharge Instructions (Signed)

## 2015-11-26 NOTE — Progress Notes (Addendum)
ANTICOAGULATION CONSULT NOTE - Follow Up Consult  Pharmacy Consult for lovenox + warfarin Indication: pulmonary embolus, DVT  No Known Allergies  Patient Measurements: Height: 5' 0.98" (154.9 cm) Weight: 115 lb 4.8 oz (52.3 kg) IBW/kg (Calculated) : 47.76  Vital Signs: Temp: 98.4 F (36.9 C) (01/03 0510) Temp Source: Oral (01/03 0510) BP: 125/64 mmHg (01/03 0510) Pulse Rate: 78 (01/03 0510)  Labs:  Recent Labs  11/24/15 1804 11/24/15 2103 11/24/15 2300 11/25/15 0347 11/25/15 1150 11/26/15 0635  HGB 10.0*  --   --  9.7*  --  10.3*  HCT 30.5*  --   --  29.9*  --  30.5*  PLT 148*  --   --  137*  --  131*  HEPARINUNFRC  --   --   --  0.16* 0.45 0.43  CREATININE 1.96*  --   --  1.54*  --  1.65*  TROPONINI 2.78* 2.34* 2.59* 2.34*  --   --     Estimated Creatinine Clearance: 16.4 mL/min (by C-G formula based on Cr of 1.65).  Assessment: 82 year old female started on IV Heparin for r/o pulmonary embolus and confirmed RLE DVT.Transitioning heparin to lovenox and warfarin. Baseline INR is pending. H/H + platelets are stable. No bleeding noted.   Goal of Therapy:  INR 2-3 Anti-Xa level 0.6-1 units/ml 4hrs after LMWH dose given Monitor platelets by anticoagulation protocol: Yes   Plan:  - Lovenox 50mg  SQ Q24H - Warfarin 2.5mg  PO x 1 tonight - Daily INR, CBC  Salome Arnt, PharmD, BCPS Pager # [phone] 11/26/2015 12:26 PM

## 2015-11-26 NOTE — Evaluation (Signed)
Clinical/Bedside Swallow Evaluation Patient Details  Name: MIKIE BATTLE MRN: [id number] Date of Birth: 1921-06-28  Today's Date: 11/26/2015 Time: SLP Start Time (ACUTE ONLY): [id number] SLP Stop Time (ACUTE ONLY): 1425 SLP Time Calculation (min) (ACUTE ONLY): 26 min  Past Medical History:  Past Medical History  Diagnosis Date  . History of conization of cervix   . Hypertension   . Hypothyroidism   . Cancer ([place]) rt breast cancer 2004  . Breast cancer ([place])   . Hearing deficit   . Cognitive impairment   . Pleural effusion    Past Surgical History:  Past Surgical History  Procedure Laterality Date  . Appendectomy  2011  . Cataract extraction, bilateral  2010    left eye  . Breast lumpectomy  2005    right  . Breast biopsy  07/09/2011    Procedure: BREAST BIOPSY;  Surgeon: Haywood Lasso, MD;  Location: [facility];  Service: General;  Laterality: Right;  preop diagnosis abnormal calcifications of right breast.  . Pars plana vitrectomy  10/21/2011    Procedure: PARS PLANA VITRECTOMY WITH 23 GAUGE;  Surgeon: Adonis Brook;  Location: [facility];  Service: Ophthalmology;  Laterality: Left;  Intraocular Lens Exchange  . Cataract extraction    . Dilatation & curettage/hysteroscopy with myosure     HPI:  82 year old female with history of metastatic breast cancer in most likely malignant pleural effusion presents with worsening dyspnea and exertional tachycardia with poor by mouth intake acute on chronic renal failure and elevated troponin   Assessment / Plan / Recommendation Clinical Impression  Pt's oropharyngeal swallow appears WFL wtih soft solids and thin liquids observed today. Daughters present describe occasionaly difficulties at home, particularly with meats and pills, which get her "choked." They also describe prolonged mastication and/or oral holding. Recommend that she continue soft solid foods and thin liquids upon return home; meds given with purees PRN. Discussed with pt/family  about types of foods and swallowing strategies that may facilitate intake. For now, will allow pt to remain on unrestricted textures so that pt/family may select foods that they know she ghas tolerated at home. They are in agreement with this plan and voice no additional questions. SLP to s/o - please reorder if we can be of additional assistance.    Aspiration Risk  Mild aspiration risk    Diet Recommendation  Regular diet (selecting softer food choices), thin liquids   Medication Administration: Whole meds with puree (with puree PRN)    Other  Recommendations Oral Care Recommendations: Oral care BID   Follow up Recommendations  24 hour supervision/assistance    Frequency and Duration            Prognosis        Swallow Study   General HPI: 82 year old female with history of metastatic breast cancer in most likely malignant pleural effusion presents with worsening dyspnea and exertional tachycardia with poor by mouth intake acute on chronic renal failure and elevated troponin Type of Study: Bedside Swallow Evaluation Previous Swallow Assessment: none in chart Diet Prior to this Study: Regular;Thin liquids Temperature Spikes Noted: No Respiratory Status: Room air History of Recent Intubation: No Behavior/Cognition: Alert;Cooperative;Pleasant mood;Confused Oral Cavity Assessment: Within Functional Limits Oral Care Completed by SLP: No Oral Cavity - Dentition: Adequate natural dentition Vision: Functional for self-feeding Self-Feeding Abilities: Able to feed self Patient Positioning: Upright in chair Baseline Vocal Quality: Normal    Oral/Motor/Sensory Function Overall Oral Motor/Sensory Function: Within functional limits   Ice Chips Ice  chips: Not tested   Thin Liquid Thin Liquid: Within functional limits Presentation: Self Fed;Straw    Nectar Thick Nectar Thick Liquid: Not tested   Honey Thick Honey Thick Liquid: Not tested   Puree Puree: Not tested   Solid   GO     Solid: Within functional limits Presentation: Self Fed      Germain Osgood, M.A. CCC-SLP [phone]  Germain Osgood 11/26/2015,3:05 PM

## 2015-11-26 NOTE — Progress Notes (Signed)
ANTICOAGULATION CONSULT NOTE - Follow Up Consult  Pharmacy Consult for heparin Indication: pulmonary embolus  No Known Allergies  Patient Measurements: Height: 5' 0.98" (154.9 cm) Weight: 115 lb 4.8 oz (52.3 kg) IBW/kg (Calculated) : 47.76  Vital Signs: Temp: 98.4 F (36.9 C) (01/03 0510) Temp Source: Oral (01/03 0510) BP: 125/64 mmHg (01/03 0510) Pulse Rate: 78 (01/03 0510)  Labs:  Recent Labs  11/24/15 1804 11/24/15 2103 11/24/15 2300 11/25/15 0347 11/25/15 1150 11/26/15 0635  HGB 10.0*  --   --  9.7*  --   --   HCT 30.5*  --   --  29.9*  --   --   PLT 148*  --   --  137*  --   --   HEPARINUNFRC  --   --   --  0.16* 0.45 0.43  CREATININE 1.96*  --   --  1.54*  --   --   TROPONINI 2.78* 2.34* 2.59* 2.34*  --   --     Estimated Creatinine Clearance: 17.6 mL/min (by C-G formula based on Cr of 1.54).   Assessment: 82 year old female started on IV Heparin for r/o pulmonary embolus. Note she is currently undergoing breast cancer treatment and was scheduled for thoracentesis this week for pleural effusion. Heparin held and thoracentesis done. Follow up CXR looked okay so restarted heparin. Currently on IV heparin at 700 units/hr. F/u HL is therapeutic at 0.43.  Goal of Therapy:  Heparin level 0.3-0.7 units/ml Monitor platelets by anticoagulation protocol: Yes   Plan:  Continue heparin gtt at 700 units/hr Monitor daily HL  Monitor daily HL, CBC, s/s of bleed  Albertina Parr, PharmD., BCPS Clinical Pharmacist Pager [phone]

## 2015-11-26 NOTE — Progress Notes (Signed)
Nutrition Consult/Follow up  DOCUMENTATION CODES:   Not applicable  INTERVENTION:   Ensure Enlive po BID, each supplement provides 350 kcal and 20 grams of protein  NEW NUTRITION DIAGNOSIS:   Increased nutrient needs related to catabolic illness as evidenced by estimated needs, ongoing  GOAL:   Patient will meet greater than or equal to 90% of their needs, unmet  MONITOR:   PO intake, Supplement acceptance, Labs, Weight trends, I & O's  ASSESSMENT:   82 year old female with history of metastatic breast cancer in most likely malignant pleural effusion presents with worsening dyspnea and exertional tachycardia with poor by mouth intake acute on chronic renal failure and elevated troponin  Initial nutrition assessment completed 1/2.  Patient started on Regular diet.  PO intake poor at 25% per flowsheet records.   Chart reviewed.  Family would like non-aggressive approach with elevated troponin (no cardiac cath).  Started on Megace 1/1.  RD to add oral nutrition supplements to optimize kcal, protein intake.  Disposition: home with Hospice.  Diet Order:  Diet regular Room service appropriate?: Yes; Fluid consistency:: Thin  Skin:  Reviewed, no issues  Last BM:  1/1  Height:   Ht Readings from Last 1 Encounters:  11/24/15 5' 0.98" (1.549 m)    Weight:   Wt Readings from Last 1 Encounters:  11/24/15 115 lb 4.8 oz (52.3 kg)    Ideal Body Weight:  45.5 kg  BMI:  Body mass index is 21.8 kg/(m^2).  Estimated Nutritional Needs:   Kcal:  1200-1400  Protein:  55-65 grams  Fluid:  >/= 1.5 L  EDUCATION NEEDS:   No education needs identified at this time  Arthur Holms, RD, LDN Pager #: [phone] After-Hours Pager #: [phone]

## 2015-11-26 NOTE — Progress Notes (Signed)
PROGRESS NOTE  Holly Patterson [id number] DOB: 01/22/1921 DOA: 11/24/2015 PCP: Patricia Nettle, MD  Assessment/Plan: DVT and presumed PE -heparin started initially--- lovenox and bridge to coumadin -echo done -will not pursue V/Q or CTA as will treat DVT -home O2 eval with PT  Elevated troponin - family would like to pursue nonaggressive approach. Not interested in cardiac catheterization. Okay with supportive management and anticoagulation. Given history of breast cancer PE is considered unfortunately secondary to renal insufficiency CTA is not an option. Will not pursue V/Q as will treat DVT  Acute on chronic renal failure ([place]) patient appears to be somewhat dehydrated will give gentle fluids   Pleural effusion -pulmonology : s/p thoracentesis  Hypertension given worsening renal function will continue to hold ARB  History of breast cancer. -will go home on hospice  Code Status: DNR Family Communication: daughter at bedside Disposition Plan:    Consultants:  pulm  Procedures:      HPI/Subjective: No SOB, no CP Eating better this AM  Objective: Filed Vitals:   11/25/15 2226 11/26/15 0510  BP: 110/60 125/64  Pulse: 83 78  Temp:  98.4 F (36.9 C)  Resp:  19    Intake/Output Summary (Last 24 hours) at 11/26/15 1254 Last data filed at 11/25/15 1600  Gross per 24 hour  Intake    120 ml  Output      0 ml  Net    120 ml   Filed Weights   11/24/15 2243  Weight: 52.3 kg (115 lb 4.8 oz)    Exam:   General:  Pleasant/cooperative  Cardiovascular: rrr  Respiratory: diminished  Abdomen: +BS, soft  Musculoskeletal: min edema   Data Reviewed: Basic Metabolic Panel:  Recent Labs Lab 11/24/15 1804 11/25/15 0347 11/26/15 0635  NA 142 144 142  K 4.4 4.5 3.9  CL 114* 115* 116*  CO2 16* 19* 18*  GLUCOSE 229* 115* 84  BUN 22* 21* 18  CREATININE 1.96* 1.54* 1.65*  CALCIUM 9.3 9.2 8.3*  MG  --  2.0  --   PHOS  --  3.5  --    Liver  Function Tests:  Recent Labs Lab 11/25/15 0347 11/25/15 2120  AST 43*  --   ALT 26  --   ALKPHOS 55  --   BILITOT 0.5  --   PROT 6.5 5.9*  ALBUMIN 2.8*  --    No results for input(s): LIPASE, AMYLASE in the last 168 hours. No results for input(s): AMMONIA in the last 168 hours. CBC:  Recent Labs Lab 11/24/15 1804 11/25/15 0347 11/26/15 0635  WBC 10.6* 10.4 10.5  HGB 10.0* 9.7* 10.3*  HCT 30.5* 29.9* 30.5*  MCV 73.5* 72.7* 72.4*  PLT 148* 137* 131*   Cardiac Enzymes:  Recent Labs Lab 11/24/15 1804 11/24/15 2103 11/24/15 2300 11/25/15 0347  TROPONINI 2.78* 2.34* 2.59* 2.34*   BNP (last 3 results)  Recent Labs  11/24/15 1804  BNP 989.8*    ProBNP (last 3 results) No results for input(s): PROBNP in the last 8760 hours.  CBG: No results for input(s): GLUCAP in the last 168 hours.  Recent Results (from the past 240 hour(s))  Body fluid culture     Status: None (Preliminary result)   Collection Time: 11/25/15 10:43 PM  Result Value Ref Range Status   Specimen Description PLEURAL RIGHT FLUID  Final   Special Requests NONE  Final   Gram Stain   Final    CYTOSPIN SMEAR WBC PRESENT, PREDOMINANTLY MONONUCLEAR NO  ORGANISMS SEEN    Culture PENDING  Incomplete   Report Status PENDING  Incomplete     Studies: Dg Chest 2 View  11/24/2015  CLINICAL DATA:  Shortness of breath and chest tightness for 2 days. EXAM: CHEST  2 VIEW COMPARISON:  10/13/2015 FINDINGS: Two-view exam again shows small right pleural effusion with right parahilar and infrahilar ill-defined opacity. Prominence of the minor fissure again noted. Left lung is clear. The cardiopericardial silhouette is within normal limits for size. The visualized bony structures of the thorax are intact. IMPRESSION: Stable exam. Small right pleural effusion with right parahilar and infrahilar opacity. Unilateral pleural effusion can be a sign of malignancy and close follow-up recommended. Electronically Signed   By:  Misty Stanley M.D.   On: 11/24/2015 18:23   Ct Chest Wo Contrast  11/24/2015  CLINICAL DATA:  Shortness of breath. Right pleural effusion. The patient is reportedly being treated for breast cancer at [facility]. EXAM: CT CHEST WITHOUT CONTRAST TECHNIQUE: Multidetector CT imaging of the chest was performed following the standard protocol without IV contrast. COMPARISON:  Chest x-rays dated 11/24/2015 and 10/13/2015 and 10/12/2011 FINDINGS: There is a large right pleural effusion. There is a partially calcified soft tissue mass on the right hemidiaphragm. This could represent scar tissue or treated tumor. The patient also has multiple calcified lymph nodes in the right hilum. There is a 21 mm masslike density in the lateral aspect of the right breast underlying the surgical scar. Heart size is normal. There is slight atelectasis or scarring in the lingula and multiple lesions in the thoracic spine, most likely representing metastatic breast cancer. The patient has fairly severe glenohumeral joint arthritis bilaterally. Old compression fracture of T8. IMPRESSION: 1. Chronic large right pleural effusion. 2. Multiple small bone lesions in the thoracic spine which could represent breast cancer metastases. 3. Small focal area of atelectasis or scarring in the lingula. 4. Soft tissue density in the lateral aspect of the right breast probably represents postsurgical change although I cannot exclude a mass. No prior studies of that area for comparison. Electronically Signed   By: Lorriane Shire M.D.   On: 11/24/2015 19:45   Dg Chest Port 1 View  11/26/2015  CLINICAL DATA:  Pleural effusion. EXAM: PORTABLE CHEST 1 VIEW COMPARISON:  11/25/2015.  CT 11/24/2015. FINDINGS: Mediastinum and hilar structures are normal . Cardiomegaly with normal pulmonary vascularity. Persistent right pleural effusion. Mild bibasilar subsegmental atelectasis. No pneumothorax. No acute bony abnormality identified. Small questionable bony lesions in the  thoracic spine best identified by prior CT . IMPRESSION: 1. Stable right pleural effusion. 2. Mild bibasilar subsegmental atelectasis. Electronically Signed   By: Marcello Moores  Register   On: 11/26/2015 07:47   Dg Chest Port 1 View  11/25/2015  CLINICAL DATA:  Status post right-sided thoracentesis. EXAM: PORTABLE CHEST 1 VIEW COMPARISON:  11/23/2014 FINDINGS: Bilateral glenohumeral joint osteoarthritis. Midline trachea. Borderline cardiomegaly. Significant decrease in tiny right pleural effusion. No pneumothorax. Mild left base scarring or atelectasis laterally. Clearing of right lung base. IMPRESSION: Significant decrease in right-sided pleural effusion, without pneumothorax. Electronically Signed   By: Abigail Miyamoto M.D.   On: 11/25/2015 20:54    Scheduled Meds: . aspirin EC  81 mg Oral Daily  . coumadin book   Does not apply Once  . docusate sodium  100 mg Oral Daily  . enoxaparin (LOVENOX) injection  50 mg Subcutaneous Q24H  . levothyroxine  75 mcg Oral QAC breakfast  . megestrol  40 mg Oral QID  .  sodium chloride  3 mL Intravenous Q12H  . warfarin  2.5 mg Oral ONCE-1800  . Warfarin - Pharmacist Dosing Inpatient   Does not apply q1800   Continuous Infusions:   Antibiotics Given (last 72 hours)    None      Active Problems:   Personal history of malignant neoplasm of breast   Hypertension   Elevated troponin   Acute on chronic renal failure (HCC)   Pleural effusion   DVT (deep venous thrombosis) ([place])    Time spent: 25 min    [facility] Pager [phone] If 7PM-7AM, please contact night-coverage at [email or web address], password [facility] 11/26/2015, 12:54 PM  LOS: 2 days

## 2015-11-26 NOTE — Consult Note (Signed)
PCCM PROGRESS NOTE  ADMISSION DATE: 11/24/2015 CONSULT DATE: 11/25/2015 REFERRING PROVIDER: Dr. Eliseo Squires  CC: Short of breath  SUBJECTIVE: Breathing better.  Appetite improved.  OBJECTIVE: BP 125/64 mmHg  Pulse 78  Temp(Src) 98.4 F (36.9 C) (Oral)  Resp 19  Ht 5' 0.98" (1.549 m)  Wt 115 lb 4.8 oz (52.3 kg)  BMI 21.80 kg/m2  SpO2 100%  I/O last 3 completed shifts: In: 120 [P.O.:120] Out: 130 [Urine:130]  General: thin HEENT: temporal wasting Cardiac: regular, no murmur Chest: no wheeze Abd: thin, soft, non tender Ext: no edema, decreased muscle bulk Neuro: pleasant, alert, normal strength Skin: no rashes   CMP Latest Ref Rng 11/26/2015 11/25/2015 11/25/2015  Glucose 65 - 99 mg/dL 84 - 115(H)  BUN 6 - 20 mg/dL 18 - 21(H)  Creatinine 0.44 - 1.00 mg/dL 1.65(H) - 1.54(H)  Sodium 135 - 145 mmol/L 142 - 144  Potassium 3.5 - 5.1 mmol/L 3.9 - 4.5  Chloride 101 - 111 mmol/L 116(H) - 115(H)  CO2 22 - 32 mmol/L 18(L) - 19(L)  Calcium 8.9 - 10.3 mg/dL 8.3(L) - 9.2  Total Protein 6.5 - 8.1 g/dL - 5.9(L) 6.5  Total Bilirubin 0.3 - 1.2 mg/dL - - 0.5  Alkaline Phos 38 - 126 U/L - - 55  AST 15 - 41 U/L - - 43(H)  ALT 14 - 54 U/L - - 26     CBC Latest Ref Rng 11/26/2015 11/25/2015 11/24/2015  WBC 4.0 - 10.5 K/uL 10.5 10.4 10.6(H)  Hemoglobin 12.0 - 15.0 g/dL 10.3(L) 9.7(L) 10.0(L)  Hematocrit 36.0 - 46.0 % 30.5(L) 29.9(L) 30.5(L)  Platelets 150 - 400 K/uL 131(L) 137(L) 148(L)     Dg Chest 2 View  11/24/2015  CLINICAL DATA:  Shortness of breath and chest tightness for 2 days. EXAM: CHEST  2 VIEW COMPARISON:  10/13/2015 FINDINGS: Two-view exam again shows small right pleural effusion with right parahilar and infrahilar ill-defined opacity. Prominence of the minor fissure again noted. Left lung is clear. The cardiopericardial silhouette is within normal limits for size. The visualized bony structures of the thorax are intact. IMPRESSION: Stable exam. Small right pleural effusion with right  parahilar and infrahilar opacity. Unilateral pleural effusion can be a sign of malignancy and close follow-up recommended. Electronically Signed   By: Misty Stanley M.D.   On: 11/24/2015 18:23   Ct Chest Wo Contrast  11/24/2015  CLINICAL DATA:  Shortness of breath. Right pleural effusion. The patient is reportedly being treated for breast cancer at [facility]. EXAM: CT CHEST WITHOUT CONTRAST TECHNIQUE: Multidetector CT imaging of the chest was performed following the standard protocol without IV contrast. COMPARISON:  Chest x-rays dated 11/24/2015 and 10/13/2015 and 10/12/2011 FINDINGS: There is a large right pleural effusion. There is a partially calcified soft tissue mass on the right hemidiaphragm. This could represent scar tissue or treated tumor. The patient also has multiple calcified lymph nodes in the right hilum. There is a 21 mm masslike density in the lateral aspect of the right breast underlying the surgical scar. Heart size is normal. There is slight atelectasis or scarring in the lingula and multiple lesions in the thoracic spine, most likely representing metastatic breast cancer. The patient has fairly severe glenohumeral joint arthritis bilaterally. Old compression fracture of T8. IMPRESSION: 1. Chronic large right pleural effusion. 2. Multiple small bone lesions in the thoracic spine which could represent breast cancer metastases. 3. Small focal area of atelectasis or scarring in the lingula. 4. Soft tissue density in the  lateral aspect of the right breast probably represents postsurgical change although I cannot exclude a mass. No prior studies of that area for comparison. Electronically Signed   By: Lorriane Shire M.D.   On: 11/24/2015 19:45   Dg Chest Port 1 View  11/26/2015  CLINICAL DATA:  Pleural effusion. EXAM: PORTABLE CHEST 1 VIEW COMPARISON:  11/25/2015.  CT 11/24/2015. FINDINGS: Mediastinum and hilar structures are normal . Cardiomegaly with normal pulmonary vascularity. Persistent right  pleural effusion. Mild bibasilar subsegmental atelectasis. No pneumothorax. No acute bony abnormality identified. Small questionable bony lesions in the thoracic spine best identified by prior CT . IMPRESSION: 1. Stable right pleural effusion. 2. Mild bibasilar subsegmental atelectasis. Electronically Signed   By: Marcello Moores  Register   On: 11/26/2015 07:47   Dg Chest Port 1 View  11/25/2015  CLINICAL DATA:  Status post right-sided thoracentesis. EXAM: PORTABLE CHEST 1 VIEW COMPARISON:  11/23/2014 FINDINGS: Bilateral glenohumeral joint osteoarthritis. Midline trachea. Borderline cardiomegaly. Significant decrease in tiny right pleural effusion. No pneumothorax. Mild left base scarring or atelectasis laterally. Clearing of right lung base. IMPRESSION: Significant decrease in right-sided pleural effusion, without pneumothorax. Electronically Signed   By: Abigail Miyamoto M.D.   On: 11/25/2015 20:54     CULTURES: 01/02 Rt pleural fluid >>  STUDIES: 01/01 CT chest >> large Rt effusion 01/02 Echo >> mild LVH, EF 60 to [id number], grade 1 diastolic dysfx, mild/mod RV systolic dysfx, PAS 46 mmHg 01/02 Doppler legs >> acute DVT Rt distal femoral vein to popliteal vein 01/02 Rt thoracentesis >> 1300 ml fluid, glucose 83, protein 3.9/5.9, LDH 224/278, WBC 450 (30%L, 67%M, 3%N)  EVENTS: 01/01 Admit 01/02 Rt thoracentesis   DISCUSSION: 82 yo female with persistent Rt pleural effusion in setting of metastatic breast cancer.  ASSESSMENT/PLAN:  Exudate right pleural effusion. Plan: - f/u pleural fluid cytology, cultures - if effusion re-accumulates, then could consider pleurx catheter placement  Rt leg DVT and possible PE. Plan: - anticoagulation per primary team  Updated pt's daughter about pleural fluid, doppler leg, and echocardiogram findings.  PCCM will sign off.  Please call if additional help is needed.  Chesley Mires, MD [facility] Pulmonary/Critical Care 11/26/2015, 8:50 AM Pager:  [phone] After  3pm call: [phone]

## 2015-11-26 NOTE — Evaluation (Signed)
Physical Therapy Evaluation Patient Details Name: Holly Patterson MRN: [id number] DOB: [birth year] Today's Date: 11/26/2015   History of Present Illness  Patient is a 82 y/o female with hx of metastatic breast cancer presents with tachycardia. Found to have acute DVT RLE, possible PE and recurrent Rt pleural effusion s/p thoracentesis.   Clinical Impression  Patient presents with functional limitations due to deficits listed in PT problem list (see below). Pt presents with baseline cognitive deficits, impaired balance, strength and endurance impacting safe mobility. Pt will have 24/7 S at home from family and plans to discharge on hospice. Attempted to use RW, however pt not able too so recommend hands on assist for ambulation for safety as pt fall risk. Will follow acutely to maximize independence and mobility prior to return home.    Follow Up Recommendations No PT follow up;Supervision/Assistance - 24 hour    Equipment Recommendations  None recommended by PT    Recommendations for Other Services OT consult     Precautions / Restrictions Precautions Precautions: Fall Restrictions Weight Bearing Restrictions: No      Mobility  Bed Mobility               General bed mobility comments: Sitting in chair upon PT arrival.  Transfers Overall transfer level: Needs assistance Equipment used: 1 person hand held assist Transfers: Sit to/from Stand Sit to Stand: Min assist         General transfer comment: Min A to boost from chair with steadying assist. Pt reaching for counter for support.  Ambulation/Gait Ambulation/Gait assistance: Min assist;Mod assist Ambulation Distance (Feet): 150 Feet Assistive device: 1 person hand held assist Gait Pattern/deviations: Step-through pattern;Decreased stride length;Narrow base of support;Drifts right/left Gait velocity: decreased Gait velocity interpretation: <1.8 ft/sec, indicative of risk for recurrent falls General Gait  Details: Slow, unsteady gait with constant Min A for balance. Placed RW in front of pt, but not able to use it. Mod A in static standing due to posterior lean. Sp02 stayed 100% on RA. 2/4 dyspnea noted.   Stairs            Wheelchair Mobility    Modified Rankin (Stroke Patients Only)       Balance Overall balance assessment: Needs assistance Sitting-balance support: Feet supported;No upper extremity supported Sitting balance-Leahy Scale: Good     Standing balance support: During functional activity Standing balance-Leahy Scale: Fair Standing balance comment: Reliant on external support for balance. Min A when washing hands at the sink due to posterior lean.                             Pertinent Vitals/Pain Pain Assessment: Faces Faces Pain Scale: No hurt    Home Living Family/patient expects to be discharged to:: Private residence Living Arrangements: Children;Other relatives Available Help at Discharge: Family;Available 24 hours/day Type of Home: House Home Access: Level entry     Home Layout: Two level;Able to live on main level with bedroom/bathroom        Prior Function Level of Independence: Needs assistance   Gait / Transfers Assistance Needed: More recently has required more assist with ambulation.   ADL's / Homemaking Assistance Needed: Per daughter in law, pt requires some assist for dressing/bathing.        Hand Dominance        Extremity/Trunk Assessment   Upper Extremity Assessment: Defer to OT evaluation  Lower Extremity Assessment: Generalized weakness      Cervical / Trunk Assessment: Kyphotic  Communication   Communication: No difficulties  Cognition Arousal/Alertness: Awake/alert Behavior During Therapy: WFL for tasks assessed/performed Overall Cognitive Status: History of cognitive impairments - at baseline (some confusion noted. )                      General Comments General comments (skin  integrity, edema, etc.): Daughter in law, Bonnita Nasuti and hospice rep present during session. Daughter, [name] arrived towards end of session.    Exercises        Assessment/Plan    PT Assessment Patient needs continued PT services  PT Diagnosis Difficulty walking;Generalized weakness   PT Problem List Decreased strength;Decreased activity tolerance;Decreased mobility;Decreased balance;Decreased safety awareness;Decreased cognition;Cardiopulmonary status limiting activity  PT Treatment Interventions Balance training;Gait training;Functional mobility training;Therapeutic activities;Therapeutic exercise;Patient/family education   PT Goals (Current goals can be found in the Care Plan section) Acute Rehab PT Goals Patient Stated Goal: to go home PT Goal Formulation: With patient Time For Goal Achievement: 12/10/15 Potential to Achieve Goals: Good    Frequency Min 3X/week   Barriers to discharge        Co-evaluation               End of Session Equipment Utilized During Treatment: Gait belt Activity Tolerance: Patient tolerated treatment well Patient left: in chair;with call bell/phone within reach;with family/visitor present Nurse Communication: Mobility status         Time: 1536-1606 PT Time Calculation (min) (ACUTE ONLY): 30 min   Charges:   PT Evaluation $PT Eval Low Complexity: 1 Procedure PT Treatments $Gait Training: 8-22 mins   PT G Codes:        Keonna Raether A Ruel Dimmick 11/26/2015, 4:42 PM Wray Kearns, [place], DPT [phone]

## 2015-11-26 NOTE — Consult Note (Signed)
Consultation Note Date: 11/26/2015   Patient Name: Holly Patterson  DOB: 03/02/1921  MRN: [id number]  Age / Sex: 82 y.o., female  PCP: Wallene Huh, MD Referring Physician: Geradine Girt, DO  Reason for Consultation: Establishing goals of care    Clinical Assessment/Narrative: I met today with Holly Patterson (very pleasant/cooperative but confused) along with her daughter Dr. Alford Highland. Lovely patient and family. 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 Her husband was a Education officer, environmental and has been dead ~22 yrs and she has 3 children (2 are physicians and 1 orthodontist). Family is very supportive of her and she has a daughter-in-law, Holly Patterson, that has moved in to care for her over the past ~ year. Daughter Holly Patterson lives in [place] but comes on the weekends and takes her mother out to dinner and spends quality time with her.   We discussed GOC. DNR confirmed although family says that 1 son has a difficult time accepting where she is and understanding her declining health status. However, they are all in agreement that the goal is to keep their mother at home and comfortable. Holly Patterson mentions several times that she does not want her mother to be fearful or anxious - this is important. They would also be interested in PleurX if indicated for comfort and management of symptoms. They speak very highly of Dr. [place]oney Harada, geriatrician that does home visits and has been helping mediate conversations and [place] for Holly Patterson. They are also wishing to continue anticoagulation for her PE.   Contacts/Participants in Discussion: Primary Decision Maker: Alford Highland   Relationship to Patient daughter   SUMMARY OF RECOMMENDATIONS - Goal is for comfort  focused care and to keep her at home with help from hospice - Continue anticoagulants - proceed with transition to Lovenox when appropriate per primary team - Consider PleurX if needed/indicated for comfort now or in future  Code Status/Advance Care Planning: DNR    Code Status Orders        Start     Ordered   11/24/15 2234  Do not attempt resuscitation (DNR)   Continuous    Question Answer Comment  In the event of cardiac or respiratory ARREST Do not call a "code blue"   In the event of cardiac or respiratory ARREST Do not perform Intubation, CPR, defibrillation or ACLS   In the event of cardiac or respiratory ARREST Use medication by any route, position, wound care, and other measures to relive pain and suffering. May use oxygen, suction and manual treatment of airway obstruction as needed for comfort.      11/24/15 2233        Symptom Management:   Denies symptoms/pain currently.   Poor appetite/malnutrition: Continue Megace and supplements between meals.   Would plan to have oxygen at home just in case with known pleural effusion.   Palliative Prophylaxis:   Bowel Regimen, Delirium Protocol, Frequent Pain Assessment and Oral Care   Psycho-social/Spiritual:  Support System: Strong Desire for further Chaplaincy support:no Additional Recommendations: Caregiving  Support/Resources and Education on Hospice  Prognosis: < 6 months  Discharge Planning: Home with Hospice   Chief Complaint/ Primary Diagnoses: Present on Admission:  . Hypertension . Elevated troponin . Acute on chronic renal failure ([place]) . Pleural effusion  I have reviewed the medical record, interviewed  the patient and family, and examined the patient. The following aspects are pertinent.  Past Medical History  Diagnosis Date  . History of conization of cervix   . Hypertension   . Hypothyroidism   . Cancer ([place]) rt breast cancer 2004  . Breast cancer ([place])   . Hearing deficit   . Cognitive  impairment   . Pleural effusion    Social History   Social History  . Marital Status: Widowed    Spouse Name: N/A  . Number of Children: N/A  . Years of Education: N/A   Social History Main Topics  . Smoking status: Former Research scientist (life sciences)  . Smokeless tobacco: None  . Alcohol Use: No     Comment: "wine once or twice a month"  . Drug Use: No  . Sexual Activity: No   Other Topics Concern  . None   Social History Narrative   Family History  Problem Relation Age of Onset  . Prostate cancer Father   . Nephrotic syndrome Sister   . Breast cancer Sister   . Breast cancer Mother   . Pancreatic cancer Sister    Scheduled Meds: . aspirin EC  81 mg Oral Daily  . docusate sodium  100 mg Oral Daily  . levothyroxine  75 mcg Oral QAC breakfast  . megestrol  40 mg Oral QID  . sodium chloride  3 mL Intravenous Q12H   Continuous Infusions: . heparin 700 Units/hr (11/25/15 2212)   PRN Meds:.acetaminophen **OR** acetaminophen, HYDROcodone-acetaminophen, levalbuterol, ondansetron **OR** ondansetron (ZOFRAN) IV Medications Prior to Admission:  Prior to Admission medications   Medication Sig Start Date End Date Taking? Authorizing Provider  aspirin 81 MG tablet Take 81 mg by mouth daily.    Yes Historical Provider, MD  cholecalciferol (VITAMIN D) 400 UNITS TABS Take 1,000 Units by mouth daily.    Yes Historical Provider, MD  docusate sodium (COLACE) 100 MG capsule Take 100 mg by mouth daily.   Yes Historical Provider, MD  levothyroxine (SYNTHROID, LEVOTHROID) 75 MCG tablet Take 75 mcg by mouth daily before breakfast.   Yes Historical Provider, MD  losartan (COZAAR) 50 MG tablet Take 50 mg by mouth daily. 09/07/15  Yes Historical Provider, MD  Lutein-Zeaxanthin 25-5 MG CAPS Take 1 capsule by mouth daily.   Yes Historical Provider, MD  megestrol (MEGACE) 40 MG tablet Take 40 mg by mouth 4 (four) times daily. 10/26/15  Yes Historical Provider, MD  Multiple Vitamin (MULTIVITAMIN WITH MINERALS) TABS  tablet Take 1 tablet by mouth daily.   Yes Historical Provider, MD  HYDROcodone-acetaminophen (NORCO/VICODIN) 5-325 MG per tablet Take 1-2 tablets by mouth every 4 (four) hours as needed. Patient not taking: Reported on 11/04/2015 03/29/14   Lacretia Leigh, MD   No Known Allergies  Review of Systems  Unable to perform ROS   Physical Exam  Constitutional: She appears cachectic. She is cooperative.  Cardiovascular: Normal rate.   Respiratory: Effort normal. No accessory muscle usage. No tachypnea. No respiratory distress.  GI: Soft. Normal appearance.  Neurological: She is alert. She is disoriented.  Psychiatric: She has a normal mood and affect.    Vital Signs: BP 125/64 mmHg  Pulse 78  Temp(Src) 98.4 F (36.9 C) (Oral)  Resp 19  Ht 5' 0.98" (1.549 m)  Wt 52.3 kg (115 lb 4.8 oz)  BMI 21.80 kg/m2  SpO2 100%  SpO2: SpO2: 100 % O2 Device:SpO2: 100 % O2 Flow Rate: .O2 Flow Rate (L/min): 2 L/min  IO: Intake/output summary:  Intake/Output  Summary (Last 24 hours) at 11/26/15 0901 Last data filed at 11/25/15 1600  Gross per 24 hour  Intake    120 ml  Output      0 ml  Net    120 ml    LBM: Last BM Date: 11/23/15 Baseline Weight: Weight: 52.3 kg (115 lb 4.8 oz) Most recent weight: Weight: 52.3 kg (115 lb 4.8 oz)      Palliative Assessment/Data:    Additional Data Reviewed:  CBC:    Component Value Date/Time   WBC 10.5 11/26/2015 0635   HGB 10.3* 11/26/2015 0635   HCT 30.5* 11/26/2015 0635   PLT 131* 11/26/2015 0635   MCV 72.4* 11/26/2015 0635   NEUTROABS 9.5* 10/13/2015 1228   LYMPHSABS 1.0 10/13/2015 1228   MONOABS 0.8 10/13/2015 1228   EOSABS 0.0 10/13/2015 1228   BASOSABS 0.0 10/13/2015 1228   Comprehensive Metabolic Panel:    Component Value Date/Time   NA 142 11/26/2015 0635   K 3.9 11/26/2015 0635   CL 116* 11/26/2015 0635   CO2 18* 11/26/2015 0635   BUN 18 11/26/2015 0635   CREATININE 1.65* 11/26/2015 0635   GLUCOSE 84 11/26/2015 0635   CALCIUM  8.3* 11/26/2015 0635   AST 43* 11/25/2015 0347   ALT 26 11/25/2015 0347   ALKPHOS 55 11/25/2015 0347   BILITOT 0.5 11/25/2015 0347   PROT 5.9* 11/25/2015 2120   ALBUMIN 2.8* 11/25/2015 0347     Time In: 0900 Time Out: 1020 Time Total: 26mn Greater than 50%  of this time was spent counseling and coordinating care related to the above assessment and plan.  Signed by: PPershing Proud NP  APershing Proud NP  18/01/7792 9:01 AM  Please contact Palliative Medicine Team phone at [phone] questions and concerns.

## 2015-11-27 LAB — CBC
HCT: 29.9 % — ABNORMAL LOW (ref 36.0–46.0)
HEMOGLOBIN: 9.7 g/dL — AB (ref 12.0–15.0)
MCH: 24.2 pg — AB (ref 26.0–34.0)
MCHC: 32.4 g/dL (ref 30.0–36.0)
MCV: 74.6 fL — ABNORMAL LOW (ref 78.0–100.0)
PLATELETS: 141 10*3/uL — AB (ref 150–400)
RBC: 4.01 MIL/uL (ref 3.87–5.11)
RDW: 17.1 % — AB (ref 11.5–15.5)
WBC: 9.4 10*3/uL (ref 4.0–10.5)

## 2015-11-27 LAB — PROTIME-INR
INR: 1.43 (ref 0.00–1.49)
PROTHROMBIN TIME: 17.6 s — AB (ref 11.6–15.2)

## 2015-11-27 MED ORDER — WARFARIN SODIUM 2.5 MG PO TABS: 2.5000 mg | ORAL_TABLET | Freq: Once | ORAL | Status: DC

## 2015-11-27 MED ORDER — WARFARIN SODIUM 5 MG PO TABS: 5.0000 mg | ORAL_TABLET | Freq: Every day | ORAL | Status: DC

## 2015-11-27 MED ORDER — ENOXAPARIN SODIUM 100 MG/ML ~~LOC~~ SOLN: 50.0000 mg | SUBCUTANEOUS | Status: DC

## 2015-11-27 NOTE — Care Management Important Message (Signed)
Important Message  Patient Details  Name: Holly Patterson MRN: [id number] Date of Birth: 06/15/1921   Medicare Important Message Given:  Yes    [name] P [place] 11/27/2015, 1:11 PM

## 2015-11-27 NOTE — Progress Notes (Signed)
Pt and pt's family have received discharge information including follow-up appointments, daily medications, and prescriptions. Pt's IV and telemetry box were removed. Pt received her daily dose of lovenox before discharge. Pt left the floor via wheelchair and was accompanied by her family and a Psychologist, occupational. Pt was in no distress at time of discharge.   Grant Fontana RN, BSN

## 2015-11-27 NOTE — Discharge Summary (Signed)
Physician Discharge Summary  Holly Patterson [id number] DOB: [birth year] DOA: 11/24/2015  PCP: Patricia Nettle, MD  Admit date: 11/24/2015 Discharge date: 11/27/2015  Time spent: greater than 30 minutes  Recommendations for Outpatient Follow-up:  1. Home with hospice 2. Monitor INR.  Adjust coumadin to keep INR between 2-3   Discharge Diagnoses:  Active Problems:   Personal history of malignant neoplasm of breast   Hypertension   Elevated troponin   Acute on chronic renal failure (HCC)   Malignant Pleural effusion   DVT (deep venous thrombosis) ([place])   Palliative care encounter   Discharge Condition: stable  Diet recommendation: regular  Filed Weights   11/24/15 2243 11/27/15 0800  Weight: 52.3 kg (115 lb 4.8 oz) 49.669 kg (109 lb 8 oz)    History of present illness:  82 y.o. female   has a past medical history of History of conization of cervix; Hypertension; Hypothyroidism; Cancer [facility]) (rt breast cancer 2004); Breast cancer ([place]); Hearing deficit; Cognitive impairment; and Pleural effusion.   Presented with  Today she was brought in by her daughter secondary to shortness of breath and inability to walk across a room she had some chest tightness last night but not currently. No fever, patient has been somewhat more lethargic than his baseline. Have had gradual deterioration. Family states she is not eating or drinking well. On Friday she had fluid resuscitation she seemed to be better but then got a bit worse.  Family noted normal oxygen levels but today she had pulse ox in 92%.  Patient's daughter noted that patient's heart rate has been going up when she is trying to ambulate even a little bit. Patient has right breast invasive lobular cancer diagnosed in 2004 treated with lumpectomy and radiation with local recurrence in 2012 was on antiestrogen therapy which has been discontinued to this time. She has recurrent right pleural effusion. Patient has been seen by Dr.  chemotherapy at [facility] regarding oncology issues. Patient has not been eating well which was treated with Megace. Regarding her pleural effusion she is scheduled for thoracentesis this week.   In emergency department patient was found to have troponin elevated up to 2.78 her creatinine was up from baseline of 1.5 to1.96. Chest CT showing chronic large right pleural effusion and small bone lesions and thoracic spine  Dear provider has initiated heparin drip Hospitalist was called for admission for symptomatic pleural effusion elevated troponin   Hospital Course:   Pleural effusion: pulmonary medicine consulted s/p thoracentesis. Cytology shows malignant cells.   Doppler leg showed right leg DVT: started on heparin gtt, then transitioned to lovenox and warfarin. Palliative care consulted and family decided to take patient home with hospice.  They decided not to pursue V/Q or CTA to r/o PE, but request anticoagulation. Hospice RN to draw INR.  Discussed with Dr. Lindi Adie who agrees to manage coumadin per family request  Elevated troponin - family would like to pursue nonaggressive approach. Not interested in cardiac catheterization.  Acute on chronic renal failure (HCC) likely prerenal azotemia. Given IVF. ARB stopped  History of breast cancer. -will go home on hospice   Procedures:  thoracentesis  Consultations:  pulmonary  Discharge Exam: Filed Vitals:   11/26/15 1700 11/26/15 2025  BP: 110/59 122/61  Pulse: 93 97  Temp: 98.5 F (36.9 C) 98.6 F (37 C)  Resp: 20 18    General: comfortable Cardiovascular: RRR Respiratory: CTA  Discharge Instructions   Discharge Instructions    Diet general  Complete by:  As directed      Walk with assistance    Complete by:  As directed           Current Discharge Medication List    START taking these medications   Details  enoxaparin (LOVENOX) 100 MG/ML injection Inject 0.5 mLs (50 mg total) into the skin daily. Qty: 3  Syringe, Refills: 0    warfarin (COUMADIN) 5 MG tablet Take 1 tablet (5 mg total) by mouth daily at 6 PM. Qty: 30 tablet, Refills: 0      CONTINUE these medications which have NOT CHANGED   Details  cholecalciferol (VITAMIN D) 400 UNITS TABS Take 1,000 Units by mouth daily.     docusate sodium (COLACE) 100 MG capsule Take 100 mg by mouth daily.    levothyroxine (SYNTHROID, LEVOTHROID) 75 MCG tablet Take 75 mcg by mouth daily before breakfast.    Lutein-Zeaxanthin 25-5 MG CAPS Take 1 capsule by mouth daily.    megestrol (MEGACE) 40 MG tablet Take 40 mg by mouth 4 (four) times daily. Refills: 3    Multiple Vitamin (MULTIVITAMIN WITH MINERALS) TABS tablet Take 1 tablet by mouth daily.      STOP taking these medications     aspirin 81 MG tablet      losartan (COZAAR) 50 MG tablet      HYDROcodone-acetaminophen (NORCO/VICODIN) 5-325 MG per tablet        No Known Allergies    The results of significant diagnostics from this hospitalization (including imaging, microbiology, ancillary and laboratory) are listed below for reference.    Significant Diagnostic Studies: Dg Chest 2 View  11/24/2015  CLINICAL DATA:  Shortness of breath and chest tightness for 2 days. EXAM: CHEST  2 VIEW COMPARISON:  10/13/2015 FINDINGS: Two-view exam again shows small right pleural effusion with right parahilar and infrahilar ill-defined opacity. Prominence of the minor fissure again noted. Left lung is clear. The cardiopericardial silhouette is within normal limits for size. The visualized bony structures of the thorax are intact. IMPRESSION: Stable exam. Small right pleural effusion with right parahilar and infrahilar opacity. Unilateral pleural effusion can be a sign of malignancy and close follow-up recommended. Electronically Signed   By: Misty Stanley M.D.   On: 11/24/2015 18:23   Ct Chest Wo Contrast  11/24/2015  CLINICAL DATA:  Shortness of breath. Right pleural effusion. The patient is reportedly  being treated for breast cancer at [facility]. EXAM: CT CHEST WITHOUT CONTRAST TECHNIQUE: Multidetector CT imaging of the chest was performed following the standard protocol without IV contrast. COMPARISON:  Chest x-rays dated 11/24/2015 and 10/13/2015 and 10/12/2011 FINDINGS: There is a large right pleural effusion. There is a partially calcified soft tissue mass on the right hemidiaphragm. This could represent scar tissue or treated tumor. The patient also has multiple calcified lymph nodes in the right hilum. There is a 21 mm masslike density in the lateral aspect of the right breast underlying the surgical scar. Heart size is normal. There is slight atelectasis or scarring in the lingula and multiple lesions in the thoracic spine, most likely representing metastatic breast cancer. The patient has fairly severe glenohumeral joint arthritis bilaterally. Old compression fracture of T8. IMPRESSION: 1. Chronic large right pleural effusion. 2. Multiple small bone lesions in the thoracic spine which could represent breast cancer metastases. 3. Small focal area of atelectasis or scarring in the lingula. 4. Soft tissue density in the lateral aspect of the right breast probably represents postsurgical change although I  cannot exclude a mass. No prior studies of that area for comparison. Electronically Signed   By: Lorriane Shire M.D.   On: 11/24/2015 19:45   Dg Chest Port 1 View  11/26/2015  CLINICAL DATA:  Pleural effusion. EXAM: PORTABLE CHEST 1 VIEW COMPARISON:  11/25/2015.  CT 11/24/2015. FINDINGS: Mediastinum and hilar structures are normal . Cardiomegaly with normal pulmonary vascularity. Persistent right pleural effusion. Mild bibasilar subsegmental atelectasis. No pneumothorax. No acute bony abnormality identified. Small questionable bony lesions in the thoracic spine best identified by prior CT . IMPRESSION: 1. Stable right pleural effusion. 2. Mild bibasilar subsegmental atelectasis. Electronically Signed   By:  Marcello Moores  Register   On: 11/26/2015 07:47   Dg Chest Port 1 View  11/25/2015  CLINICAL DATA:  Status post right-sided thoracentesis. EXAM: PORTABLE CHEST 1 VIEW COMPARISON:  11/23/2014 FINDINGS: Bilateral glenohumeral joint osteoarthritis. Midline trachea. Borderline cardiomegaly. Significant decrease in tiny right pleural effusion. No pneumothorax. Mild left base scarring or atelectasis laterally. Clearing of right lung base. IMPRESSION: Significant decrease in right-sided pleural effusion, without pneumothorax. Electronically Signed   By: Abigail Miyamoto M.D.   On: 11/25/2015 20:54   Bilateral leg doppler Findings consistent with acute deep vein thrombosis involving the  right lower extremity: distal femoral vein down to popliteal  vein. - No evidence of superficial thrombosis involving the right lower  extremity. - No evidence of deep vein or superficial thrombosis involving the  left lower extremity and right common femoral vein. - No evidence of Baker&'s cyst on the right or left.  Echo Left ventricle: The cavity size was normal. Wall thickness was increased in a pattern of mild LVH. Systolic function was normal. The estimated ejection fraction was in the range of 60% to 65%. Wall motion was normal; there were no regional wall motion abnormalities. Doppler parameters are consistent with abnormal left ventricular relaxation (grade 1 diastolic dysfunction). - Ventricular septum: The contour showed diastolic flattening. - Mitral valve: There was trivial regurgitation. - Right ventricle: The cavity size was moderately dilated. Systolic function was mildly to moderately reduced. - Right atrium: The atrium was mildly dilated. Central venous pressure (est): 8 mm Hg. - Atrial septum: The septum bowed from right to left, consistent with increased right atrial pressure. - Tricuspid valve: There was moderate regurgitation. - Pulmonary arteries: Systolic pressure was moderately  increased. PA peak pressure: 46 mm Hg (S). - Pericardium, extracardiac: There was no pericardial effusion.  Impressions:  - MIld LVH with LVEF 60-65% and grade 1 diastolic dysfunction. Moderate RV dilatation with mildly to moderately reduced contraction. Moderate tricuspid regurgitation with PASP 46 mmHg.  Microbiology: Recent Results (from the past 240 hour(s))  Body fluid culture     Status: None (Preliminary result)   Collection Time: 11/25/15 10:43 PM  Result Value Ref Range Status   Specimen Description PLEURAL RIGHT FLUID  Final   Special Requests NONE  Final   Gram Stain   Final    CYTOSPIN SMEAR WBC PRESENT, PREDOMINANTLY MONONUCLEAR NO ORGANISMS SEEN    Culture PENDING  Incomplete   Report Status PENDING  Incomplete     Labs: Basic Metabolic Panel:  Recent Labs Lab 11/24/15 1804 11/25/15 0347 11/26/15 0635  NA 142 144 142  K 4.4 4.5 3.9  CL 114* 115* 116*  CO2 16* 19* 18*  GLUCOSE 229* 115* 84  BUN 22* 21* 18  CREATININE 1.96* 1.54* 1.65*  CALCIUM 9.3 9.2 8.3*  MG  --  2.0  --   PHOS  --  3.5  --    Liver Function Tests:  Recent Labs Lab 11/25/15 0347 11/25/15 2120  AST 43*  --   ALT 26  --   ALKPHOS 55  --   BILITOT 0.5  --   PROT 6.5 5.9*  ALBUMIN 2.8*  --    No results for input(s): LIPASE, AMYLASE in the last 168 hours. No results for input(s): AMMONIA in the last 168 hours. CBC:  Recent Labs Lab 11/24/15 1804 11/25/15 0347 11/26/15 0635 11/27/15 0351  WBC 10.6* 10.4 10.5 9.4  HGB 10.0* 9.7* 10.3* 9.7*  HCT 30.5* 29.9* 30.5* 29.9*  MCV 73.5* 72.7* 72.4* 74.6*  PLT 148* 137* 131* 141*   Cardiac Enzymes:  Recent Labs Lab 11/24/15 1804 11/24/15 2103 11/24/15 2300 11/25/15 0347  TROPONINI 2.78* 2.34* 2.59* 2.34*   BNP: BNP (last 3 results)  Recent Labs  11/24/15 1804  BNP 989.8*    ProBNP (last 3 results) No results for input(s): PROBNP in the last 8760 hours.  CBG: No results for input(s): GLUCAP in the  last 168 hours.     Signed:  Delfina Redwood MD [facility] 11/27/2015, 10:27 AM

## 2015-11-27 NOTE — Clinical Documentation Improvement (Signed)
Hospitalist  Can the diagnosis of CKD be further specified?   CKD Stage I - GFR greater than or equal to 90  CKD Stage II - GFR 60-89  CKD Stage III - GFR 30-59  CKD Stage IV - GFR 15-29  CKD Stage V - GFR < 15  ESRD (End Stage Renal Disease)  Other condition  Unable to clinically determine   Supporting Information: : (risk factors, signs and symptoms, diagnostics, treatment) On admission - BUN was 22, creatinine was 1.96 and GFR was 24.  Stated in medical record that pt has CKD.  Please add stage in medical record.  Please exercise your independent, professional judgment when responding. A specific answer is not anticipated or expected.   Thank You, [place] [phone]

## 2015-11-27 NOTE — Care Management Note (Addendum)
Case Management Note  Patient Details  Name: Holly Patterson MRN: [id number] Date of Birth: May 20, 1921  Subjective/Objective:     Pt admitted with Malignant Pleural Effusions               Action/Plan:  Pt is independent from home.  Pt will discharge home with hospice, pt will have care provided by family (mostly daughter in law [place]) for 24 hours a day.  Pts daughter [name] will also provide support for pt.  Pt will sleep on main level of home (guest room) as the bed is closer to the floor.   Expected Discharge Date:                  Expected Discharge Plan:  Home w Hospice Care  In-House Referral:     Discharge planning Services  CM Consult  Post Acute Care Choice:  Durable Medical Equipment Choice offered to:  Patient  DME Arranged:    DME Agency:   ([place])  HH Arranged:  RN [facility] Agency:   ([place])  Status of Service:  In process, will continue to follow  Medicare Important Message Given:    Date Medicare IM Given:    Medicare IM give by:    Date Additional Medicare IM Given:    Additional Medicare Important Message give by:     If discussed at [place] of Stay Meetings, dates discussed:    Additional Comments: 11/27/2015  Information faxed:  Lovenox prescription, RN request for INR checks, DME orders.  CM met with daughter in law Bonnita Nasuti and son Eddie Dibbles at bedside, CM discussed discharge plan, family is aligned.  CM contacted [place] liaison to discuss pending discharge today.  CM will fax DME orders to agency per request and equipment will be delivered to the home.  Family requested that hospice agency provide PRN O2 at home, agency accepted request and will arrange for delivery of oxygen at the home, orders are not needed for home oxygen per liason.  CM will fax Lovenox prescription to agency per liason, agency will provide Lovenox to pt.  During progression with MD present bedside nurse communicated that pt was assessed for  desaturation with ambulation, pt passed and therefore continuous home O2 is not needed  11/26/15 Requested information faxed at 11:42  CM assessed pt; Pt is very pleasant, daughter [name] at bedside.  Pt was offered choice of [facility] [place], pt/daughter chose [facility].  CM contacted agency and provided referral to [facility].  CM will fax requested documentation to agency at [phone].  Per Lovena Le; agency will send RN to talk with pt and daughter at bedside today and follow up with any needs with CM.  CM requested PT evaluation and nutritional consult prior to discharge per daughter request. Maryclare Labrador, RN 11/27/2015, 10:05 AM

## 2015-11-27 NOTE — Progress Notes (Signed)
ANTICOAGULATION CONSULT NOTE - Follow Up Consult  Pharmacy Consult for lovenox + warfarin Indication: pulmonary embolus, DVT  No Known Allergies  Patient Measurements: Height: 5' 0.98" (154.9 cm) Weight: 109 lb 8 oz (49.669 kg) IBW/kg (Calculated) : 47.76  Vital Signs: Temp: 98.6 F (37 C) (01/03 2025) Temp Source: Oral (01/03 2025) BP: 122/61 mmHg (01/03 2025) Pulse Rate: 97 (01/03 2025)  Labs:  Recent Labs  11/24/15 1804 11/24/15 2103 11/24/15 2300 11/25/15 0347 11/25/15 1150 11/26/15 0635 11/26/15 1345 11/27/15 0351  HGB 10.0*  --   --  9.7*  --  10.3*  --  9.7*  HCT 30.5*  --   --  29.9*  --  30.5*  --  29.9*  PLT 148*  --   --  137*  --  131*  --  141*  LABPROT  --   --   --   --   --   --  17.3* 17.6*  INR  --   --   --   --   --   --  1.40 1.43  HEPARINUNFRC  --   --   --  0.16* 0.45 0.43  --   --   CREATININE 1.96*  --   --  1.54*  --  1.65*  --   --   TROPONINI 2.78* 2.34* 2.59* 2.34*  --   --   --   --     Estimated Creatinine Clearance: 16.4 mL/min (by C-G formula based on Cr of 1.65).  Assessment: 82 year old female started on IV Heparin for r/o pulmonary embolus and confirmed RLE DVT.Transitioned to lovenox and warfarin. INR is subtherapeutic as expected after only one dose. H/H + platelets are stable. No bleeding noted.   Goal of Therapy:  INR 2-3 Anti-Xa level 0.6-1 units/ml 4hrs after LMWH dose given Monitor platelets by anticoagulation protocol: Yes   Plan:  - Continue Lovenox 50mg  SQ Q24H - Repeat Warfarin 2.5mg  PO x 1 tonight - Daily INR, CBC *If discharge, would recommend to continue warfarin 2.5mg  PO daily and lovenox 50mg  SQ Q24H until INR is therapeutic or for at least 5 days  Salome Arnt, PharmD, BCPS Pager # [phone] 11/27/2015 8:14 AM

## 2015-11-29 ENCOUNTER — Other Ambulatory Visit: Payer: Self-pay

## 2015-11-29 DIAGNOSIS — J90 Pleural effusion, not elsewhere classified: Secondary | ICD-10-CM

## 2015-11-29 DIAGNOSIS — C50511 Malignant neoplasm of lower-outer quadrant of right female breast: Secondary | ICD-10-CM

## 2015-11-29 LAB — BODY FLUID CULTURE: Culture: NO GROWTH

## 2015-11-29 MED ORDER — RIVAROXABAN 20 MG PO TABS: 20.0000 mg | ORAL_TABLET | Freq: Every day | ORAL | Status: DC

## 2015-11-29 NOTE — Progress Notes (Signed)
Let pt son - Dr. Chrissie Noa - know that pt will be changed to xarelto for anti-coagulation.  Confirmed with him that she does not need to take her coumadin or her lovenox today and that she does not need to come in to clinic for INR.   Son questioned follow up appt with Dr Lindi Adie.  Let him know that she does not need to be seen by Dr Lindi Adie and that hospice MD will provide all care for patient and let us know if we need to see her for any reason.  Confirmed with him that patient had been admitted to hospice and that the hospice MD will come to see the patient and she will have regularly scheduled nurse visits.  Discussed that hospice is indicated for patients within 6 months expected end of life with a focus on quality of life.  He voiced understanding.    Per Dr. Lindi Adie, Rx sent to [facility] for Xarelto 20 daily.

## 2015-12-16 ENCOUNTER — Ambulatory Visit (INDEPENDENT_AMBULATORY_CARE_PROVIDER_SITE_OTHER)
Admission: RE | Admit: 2015-12-16 | Discharge: 2015-12-16 | Disposition: A | Discharge status: Home / Self Care | Payer: Medicare Other | Source: Ambulatory Visit | Attending: Internal Medicine | Admitting: Internal Medicine

## 2015-12-16 ENCOUNTER — Ambulatory Visit (INDEPENDENT_AMBULATORY_CARE_PROVIDER_SITE_OTHER): Admitting: Internal Medicine

## 2015-12-16 ENCOUNTER — Encounter: Payer: Self-pay | Admitting: Internal Medicine

## 2015-12-16 VITALS — BP 128/68 | HR 87 | Temp 97.6°F | Resp 16 | Ht 61.0 in | Wt 113.0 lb

## 2015-12-16 DIAGNOSIS — J90 Pleural effusion, not elsewhere classified: Secondary | ICD-10-CM | POA: Diagnosis not present

## 2015-12-16 DIAGNOSIS — I82409 Acute embolism and thrombosis of unspecified deep veins of unspecified lower extremity: Secondary | ICD-10-CM | POA: Diagnosis not present

## 2015-12-16 DIAGNOSIS — Z1589 Genetic susceptibility to other disease: Secondary | ICD-10-CM | POA: Insufficient documentation

## 2015-12-16 DIAGNOSIS — Z0000 Encounter for general adult medical examination without abnormal findings: Secondary | ICD-10-CM | POA: Diagnosis not present

## 2015-12-16 DIAGNOSIS — C799 Secondary malignant neoplasm of unspecified site: Secondary | ICD-10-CM

## 2015-12-16 DIAGNOSIS — R413 Other amnesia: Secondary | ICD-10-CM | POA: Insufficient documentation

## 2015-12-16 DIAGNOSIS — R630 Anorexia: Secondary | ICD-10-CM

## 2015-12-16 DIAGNOSIS — C50919 Malignant neoplasm of unspecified site of unspecified female breast: Secondary | ICD-10-CM | POA: Insufficient documentation

## 2015-12-16 DIAGNOSIS — M216X2 Other acquired deformities of left foot: Secondary | ICD-10-CM

## 2015-12-16 DIAGNOSIS — E038 Other specified hypothyroidism: Secondary | ICD-10-CM

## 2015-12-16 DIAGNOSIS — I10 Essential (primary) hypertension: Secondary | ICD-10-CM

## 2015-12-16 DIAGNOSIS — N189 Chronic kidney disease, unspecified: Secondary | ICD-10-CM | POA: Insufficient documentation

## 2015-12-16 MED ORDER — MEGESTROL ACETATE 40 MG PO TABS: 40.0000 mg | ORAL_TABLET | Freq: Four times a day (QID) | ORAL | Status: AC

## 2015-12-16 NOTE — Patient Instructions (Signed)
  We have reviewed your prior records including labs and tests today.    Medications reviewed and updated.  No changes recommended at this time.  Your prescription(s) have been submitted to your pharmacy. Please take as directed and contact our office if you believe you are having problem(s) with the medication(s).  A referral was ordered to orthotics.    Please followup as needed

## 2015-12-16 NOTE — Progress Notes (Signed)
Pre visit review using our clinic review tool, if applicable. No additional management support is needed unless otherwise documented below in the visit note. 

## 2015-12-16 NOTE — Assessment & Plan Note (Signed)
Related to metastatic cancer Status post thoracentesis Follow-up chest x-ray today

## 2015-12-16 NOTE — Assessment & Plan Note (Addendum)
Likely associated with PE, but unable to confirm due to inability to have contrast Currently on Xarelto No lower extremity edema or pain

## 2015-12-16 NOTE — Assessment & Plan Note (Signed)
Was on medication, but with weight loss she became hypotensive Medication discontinued and blood pressure has been controlled

## 2015-12-16 NOTE — Assessment & Plan Note (Signed)
Thyroid function stable on 75 g daily Continue current dose

## 2015-12-16 NOTE — Assessment & Plan Note (Signed)
On Megace and tolerating it well Appetite is improving She has gained 2 pounds recently

## 2015-12-16 NOTE — Progress Notes (Signed)
Subjective:    Patient ID: Holly Patterson, female    DOB: December 09, 1920, 82 y.o.   MRN: [id number]  HPI She is here to establish with a new primary care physician. She does have some memory loss and her daughter, who is a pediatrician is here with her.  Metastatic breast cancer: She follows with oncology [facility] and also having local oncologist. She was recently admitted to the hospital for increasing shortness of breath and hypoxia on exertion. She was found to have a right lower extremity DVT from her inguinal region 12 popliteal fossa. Most likely she had multiple pulmonary embolisms, but a CT scan was not able to be done because of her kidney disease. She was also found to have a pleural effusion on the right side that is likely related to the cancer. This was first found in September, but didn't increase in size. She had a thoracentesis while in the hospital and 1.3 L of fluid was removed. She does need a follow-up chest x-ray.  She has seen palliative care and will be transitioning into hospice. She does have nurses coming to her home. Her daughter-in-law is living with her and helping to care for her.  Right lower extremity DVT colon extending from the inguinal region to the popliteal fossa. Associated with probable multiple PEs-not confirmed due to inability to have contrast. Currently on anticoagulation with Xarelto.  Mild memory loss: She had a CT scan in December that showed mild atrophy. Her memory loss is mild more likely related to normal aging.    She has had significant weight loss related to decreased appetite. She is currently on Megace and is doing better since being out of the hospital. Her appetite has improved and she has gained a couple pounds since being discharged. She does have some difficulty swallowing pills only and they currently crushed and put them in ice cream.  Pronation of left foot: Her daughter also mentioned that she seems to be pronating her left foot, which  is throwing her balance off. She wonders about a possible orthotic for her shoe.  Medications and allergies reviewed with patient and updated if appropriate.  Patient Active Problem List   Diagnosis Date Noted  . Palliative care encounter 11/26/2015  . DVT (deep venous thrombosis) ([place]) 11/25/2015  . Elevated troponin 11/24/2015  . Acute on chronic renal failure ([place]) 11/24/2015  . Pleural effusion 11/24/2015  . Dyspnea   . Decreased appetite 11/05/2015  . Anisometropia 10/21/2011  . Breast cancer of lower-outer quadrant of right female breast ([place]) 07/09/2011  . Hypertension 07/09/2011  . Hypothyroid 07/09/2011  . Abnormal finding on ultrasound 01/07/2011  . Personal history of malignant neoplasm of breast 02/02/2008  . INTERNAL HEMORRHOIDS 08/26/2000    Current Outpatient Prescriptions on File Prior to Visit  Medication Sig Dispense Refill  . levothyroxine (SYNTHROID, LEVOTHROID) 75 MCG tablet Take 75 mcg by mouth daily before breakfast.    . megestrol (MEGACE) 40 MG tablet Take 40 mg by mouth 4 (four) times daily.  3  . Multiple Vitamin (MULTIVITAMIN WITH MINERALS) TABS tablet Take 1 tablet by mouth daily.    . rivaroxaban (XARELTO) 20 MG TABS tablet Take 1 tablet (20 mg total) by mouth daily with supper. 30 tablet 5   No current facility-administered medications on file prior to visit.    Past Medical History  Diagnosis Date  . History of conization of cervix   . Hypertension   . Hypothyroidism   . Cancer ([place])  rt breast cancer 2004  . Breast cancer ([place])   . Hearing deficit   . Cognitive impairment   . Pleural effusion     Past Surgical History  Procedure Laterality Date  . Appendectomy  2011  . Cataract extraction, bilateral  2010    left eye  . Breast lumpectomy  2005    right  . Breast biopsy  07/09/2011    Procedure: BREAST BIOPSY;  Surgeon: Haywood Lasso, MD;  Location: [facility];  Service: General;  Laterality: Right;  preop diagnosis abnormal  calcifications of right breast.  . Pars plana vitrectomy  10/21/2011    Procedure: PARS PLANA VITRECTOMY WITH 23 GAUGE;  Surgeon: Adonis Brook;  Location: [facility];  Service: Ophthalmology;  Laterality: Left;  Intraocular Lens Exchange  . Cataract extraction    . Dilatation & curettage/hysteroscopy with myosure      Social History   Social History  . Marital Status: Widowed    Spouse Name: N/A  . Number of Children: N/A  . Years of Education: N/A   Social History Main Topics  . Smoking status: Former Research scientist (life sciences)  . Smokeless tobacco: None  . Alcohol Use: No     Comment: "wine once or twice a month"  . Drug Use: No  . Sexual Activity: No   Other Topics Concern  . None   Social History Narrative    Family History  Problem Relation Age of Onset  . Prostate cancer Father   . Nephrotic syndrome Sister   . Breast cancer Sister   . Breast cancer Mother   . Pancreatic cancer Sister     Review of Systems  Constitutional: Negative for fever and chills.       Admitting improving has gained 2 pounds recently after significant weight loss  HENT: Positive for trouble swallowing (pills).   Respiratory: Negative for cough and wheezing.   Cardiovascular: Negative for chest pain, palpitations and leg swelling.  Gastrointestinal: Negative for nausea, abdominal pain, diarrhea, constipation and abdominal distention.  Genitourinary: Negative for frequency.  Musculoskeletal: Negative for myalgias, back pain and arthralgias.  Neurological: Negative for dizziness, light-headedness and headaches.  Psychiatric/Behavioral: Negative for sleep disturbance and dysphoric mood. The patient is not nervous/anxious.        Objective:   Filed Vitals:   12/16/15 1624  BP: 128/68  Pulse: 87  Temp: 97.6 F (36.4 C)  Resp: 16   Filed Weights   12/16/15 1624  Weight: 113 lb (51.256 kg)   Body mass index is 21.36 kg/(m^2).   Physical Exam  Constitutional:  Elderly female in no acute distress    HENT:  Head: Normocephalic and atraumatic.  Right Ear: External ear normal.  Left Ear: External ear normal.  Mouth/Throat: Oropharynx is clear and moist.  Eyes: Conjunctivae are normal.  Neck: Neck supple. No JVD present. No tracheal deviation present. No thyromegaly present.  Cardiovascular: Normal rate, regular rhythm and normal heart sounds.   No murmur heard. Pulmonary/Chest: Effort normal. No respiratory distress. She has no wheezes.  Mild bibasilar crackles  Abdominal: Soft. She exhibits no distension. There is no tenderness.  Musculoskeletal: She exhibits no edema.  Lymphadenopathy:    She has no cervical adenopathy.  Skin: Skin is warm and dry.          Assessment & Plan:  See Problem List for Assessment and Plan of chronic medical problems.   Follow-up as needed

## 2015-12-16 NOTE — Assessment & Plan Note (Signed)
Following with oncology and is currently on palliative care/hospice

## 2015-12-26 ENCOUNTER — Other Ambulatory Visit: Payer: Self-pay | Admitting: Internal Medicine

## 2015-12-26 DIAGNOSIS — M79673 Pain in unspecified foot: Secondary | ICD-10-CM

## 2016-01-24 ENCOUNTER — Ambulatory Visit (INDEPENDENT_AMBULATORY_CARE_PROVIDER_SITE_OTHER): Payer: BLUE CROSS/BLUE SHIELD | Admitting: Podiatry

## 2016-01-24 ENCOUNTER — Encounter: Payer: Self-pay | Admitting: Podiatry

## 2016-01-24 VITALS — BP 141/84 | HR 88 | Resp 12

## 2016-01-24 DIAGNOSIS — M216X2 Other acquired deformities of left foot: Secondary | ICD-10-CM

## 2016-01-24 DIAGNOSIS — R52 Pain, unspecified: Secondary | ICD-10-CM | POA: Diagnosis not present

## 2016-01-24 DIAGNOSIS — M216X1 Other acquired deformities of right foot: Secondary | ICD-10-CM

## 2016-01-24 DIAGNOSIS — M2042 Other hammer toe(s) (acquired), left foot: Secondary | ICD-10-CM | POA: Diagnosis not present

## 2016-01-24 DIAGNOSIS — M2041 Other hammer toe(s) (acquired), right foot: Secondary | ICD-10-CM | POA: Diagnosis not present

## 2016-01-24 NOTE — Progress Notes (Signed)
   Subjective:    Patient ID: Holly Patterson, female    DOB: 26-Jul-1921, 82 y.o.   MRN: [id number]  HPI this patient presents the office for 2 concerns. She says she has developed callus on the top of the toes on both feet. She is not experiencing any pain or discomfort walking or wearing her shoes. Her daughter does say that she has a tendency to have her foot flatten  and as she walks. She is concerned about the proper shoes that she should be wearing to help to control this problem. No self treatment nor sought any professional help for these conditions. This  patient presents for an evaluation and treatment of her feet The patient presents here today with "B/L top of feet that may have callouses since 1 year." She also has a right foot that with walking turns inwards since several years.  Review of Systems  Constitutional: Positive for fatigue.  HENT: Positive for hearing loss and trouble swallowing.   Respiratory: Positive for shortness of breath.   Endocrine: Positive for cold intolerance.  Musculoskeletal: Positive for gait problem.  Hematological: Bruises/bleeds easily.       Objective:   Physical Exam GENERAL APPEARANCE: Alert, conversant. Appropriately groomed. No acute distress.  VASCULAR: Pedal pulses palpable at  [facility] and PT bilateral.  Capillary refill time is immediate to all digits,  Normal temperature gradient.  Digital hair growth is present bilateral  NEUROLOGIC: sensation is normal to 5.07 monofilament at 5/5 sites bilateral.  Light touch is intact bilateral, Muscle strength normal.  MUSCULOSKELETAL: acceptable muscle strength, tone and stability bilateral.  Intrinsic muscluature intact bilateral.  Rectus appearance of foot and digits noted bilateral. HAV 1st MPJ B/L  DERMATOLOGIC: skin color, texture, and turgor are within normal limits.  No preulcerative lesions or ulcers  are seen, no interdigital maceration noted.  No open lesions present.  Digital nails are  asymptomatic. No drainage noted. Asymptomatic hammer toes 3 left and 4 right.          Assessment & Plan:  Hammer toes B/L   Pronation.   IE  Recommended 3/4 spenco insoles to be worn in her shoes.   RTC prn   Gardiner Barefoot DPM

## 2016-02-03 ENCOUNTER — Encounter: Payer: Self-pay | Admitting: *Deleted

## 2016-02-03 NOTE — Progress Notes (Signed)
Received office notes from [facility], reviewed by Dr. Lindi Adie, sent to scan.

## 2016-02-04 ENCOUNTER — Telehealth: Payer: Self-pay | Admitting: Internal Medicine

## 2016-02-04 DIAGNOSIS — J90 Pleural effusion, not elsewhere classified: Secondary | ICD-10-CM

## 2016-02-04 DIAGNOSIS — I2699 Other pulmonary embolism without acute cor pulmonale: Secondary | ICD-10-CM

## 2016-02-04 NOTE — Telephone Encounter (Signed)
cxr ordered - get Dr. Rip Harbour fax so we can fax results

## 2016-02-04 NOTE — Telephone Encounter (Signed)
LVM for daughter, informing CXR was entered.

## 2016-02-04 NOTE — Addendum Note (Signed)
Addended by: Binnie Rail on: 02/04/2016 01:53 PM   Modules accepted: Orders

## 2016-02-04 NOTE — Telephone Encounter (Signed)
Dr. Burt Knack, daughter, called state Dr. Rip Harbour the oncologist at [facility] wants Holly Patterson to have a chest x-ray follow up due to pulmonary embolism (she is on anticoagulant for this already). They were wondering since Mrs. Adriance live in [place], can Dr. Quay Burow order the x-ray for her and forward the result to Dr. Rip Harbour so she does not have to travel so far? Please call them back  Dr. Burt Knack (daugter) # [phone]  Mrs. Carol [place] # [phone]

## 2016-02-04 NOTE — Telephone Encounter (Signed)
Please advise 

## 2016-02-05 ENCOUNTER — Ambulatory Visit (INDEPENDENT_AMBULATORY_CARE_PROVIDER_SITE_OTHER)
Admission: RE | Admit: 2016-02-05 | Discharge: 2016-02-05 | Disposition: A | Discharge status: Home / Self Care | Payer: Medicare Other | Source: Ambulatory Visit | Attending: Internal Medicine | Admitting: Internal Medicine

## 2016-02-05 DIAGNOSIS — I2699 Other pulmonary embolism without acute cor pulmonale: Secondary | ICD-10-CM | POA: Diagnosis not present

## 2016-02-05 DIAGNOSIS — J90 Pleural effusion, not elsewhere classified: Secondary | ICD-10-CM

## 2016-02-11 ENCOUNTER — Telehealth: Payer: Self-pay | Admitting: Pulmonary Disease

## 2016-02-11 ENCOUNTER — Other Ambulatory Visit: Payer: Self-pay | Admitting: Internal Medicine

## 2016-02-11 DIAGNOSIS — J90 Pleural effusion, not elsewhere classified: Secondary | ICD-10-CM

## 2016-02-11 NOTE — Telephone Encounter (Signed)
Called and spoke to pt's daughter, Dr. Alford Highland. Dr. Burt Knack states pt saw Dr. Elsworth Soho while admitted to [facility] in 11/2015, and performed a thoracentesis. Dr. Burt Knack states the pt has an increase in dyspnea when active with an exertional spO2 at 87%. Dr. Burt Knack states she was informed that the pt's CXR (CXR ordered by Dr. Quay Burow) shows an increase in pleural effusion and would like this to be evaluated. Dr. Burt Knack states she was informed by pt's hematologist that her Eliquis needs to be held for 3 doses to have thoracentesis. Dr. Burt Knack states she would hold the Eliquis then take pt to ED later this week to be evaluated for possible repeat thoracentesis. Pt has not been seen in the office before. Dr. Burt Knack requested Dr. Elsworth Soho be informed and would prefer Dr. Elsworth Soho be involved in pt's care as they had a very pleasant experience while pt was under Dr. Bari Mantis care while in hospital.   Will send to Dr. Elsworth Soho as Juluis Rainier.

## 2016-02-11 NOTE — Telephone Encounter (Signed)
She need not go to the ER for this Holly Patterson, can you double book her at 1:30 tomorrow? We can then set up for outpatient thoracentesis

## 2016-02-12 ENCOUNTER — Encounter (HOSPITAL_COMMUNITY): Payer: Self-pay | Admitting: *Deleted

## 2016-02-12 ENCOUNTER — Encounter: Payer: Self-pay | Admitting: Pulmonary Disease

## 2016-02-12 ENCOUNTER — Telehealth: Payer: Self-pay

## 2016-02-12 ENCOUNTER — Emergency Department (HOSPITAL_COMMUNITY)
Admission: EM | Admit: 2016-02-12 | Discharge: 2016-02-12 | Disposition: A | Discharge status: Home / Self Care | Attending: Emergency Medicine | Admitting: Emergency Medicine

## 2016-02-12 ENCOUNTER — Ambulatory Visit (INDEPENDENT_AMBULATORY_CARE_PROVIDER_SITE_OTHER): Payer: Medicare Other | Admitting: Pulmonary Disease

## 2016-02-12 VITALS — BP 132/80 | HR 102 | Ht 62.0 in | Wt 115.0 lb

## 2016-02-12 DIAGNOSIS — R0682 Tachypnea, not elsewhere classified: Secondary | ICD-10-CM | POA: Diagnosis not present

## 2016-02-12 DIAGNOSIS — Z7901 Long term (current) use of anticoagulants: Secondary | ICD-10-CM | POA: Insufficient documentation

## 2016-02-12 DIAGNOSIS — J910 Malignant pleural effusion: Secondary | ICD-10-CM

## 2016-02-12 DIAGNOSIS — J90 Pleural effusion, not elsewhere classified: Secondary | ICD-10-CM

## 2016-02-12 DIAGNOSIS — I10 Essential (primary) hypertension: Secondary | ICD-10-CM | POA: Insufficient documentation

## 2016-02-12 DIAGNOSIS — E860 Dehydration: Secondary | ICD-10-CM | POA: Insufficient documentation

## 2016-02-12 DIAGNOSIS — Z79899 Other long term (current) drug therapy: Secondary | ICD-10-CM | POA: Diagnosis not present

## 2016-02-12 DIAGNOSIS — K920 Hematemesis: Secondary | ICD-10-CM | POA: Diagnosis not present

## 2016-02-12 DIAGNOSIS — Z853 Personal history of malignant neoplasm of breast: Secondary | ICD-10-CM | POA: Diagnosis not present

## 2016-02-12 DIAGNOSIS — H9190 Unspecified hearing loss, unspecified ear: Secondary | ICD-10-CM | POA: Diagnosis not present

## 2016-02-12 DIAGNOSIS — E039 Hypothyroidism, unspecified: Secondary | ICD-10-CM | POA: Insufficient documentation

## 2016-02-12 DIAGNOSIS — R042 Hemoptysis: Secondary | ICD-10-CM | POA: Diagnosis present

## 2016-02-12 DIAGNOSIS — Z8709 Personal history of other diseases of the respiratory system: Secondary | ICD-10-CM | POA: Insufficient documentation

## 2016-02-12 DIAGNOSIS — Z87891 Personal history of nicotine dependence: Secondary | ICD-10-CM | POA: Insufficient documentation

## 2016-02-12 LAB — CBC
HCT: 41.3 % (ref 36.0–46.0)
Hemoglobin: 13.3 g/dL (ref 12.0–15.0)
MCH: 23.7 pg — ABNORMAL LOW (ref 26.0–34.0)
MCHC: 32.2 g/dL (ref 30.0–36.0)
MCV: 73.6 fL — ABNORMAL LOW (ref 78.0–100.0)
Platelets: 285 10*3/uL (ref 150–400)
RBC: 5.61 MIL/uL — ABNORMAL HIGH (ref 3.87–5.11)
RDW: 15.1 % (ref 11.5–15.5)
WBC: 7 10*3/uL (ref 4.0–10.5)

## 2016-02-12 LAB — BASIC METABOLIC PANEL
Anion gap: 19 — ABNORMAL HIGH (ref 5–15)
BUN: 29 mg/dL — ABNORMAL HIGH (ref 6–20)
CO2: 20 mmol/L — ABNORMAL LOW (ref 22–32)
Calcium: 10.4 mg/dL — ABNORMAL HIGH (ref 8.9–10.3)
Chloride: 109 mmol/L (ref 101–111)
Creatinine, Ser: 1.8 mg/dL — ABNORMAL HIGH (ref 0.44–1.00)
GFR calc Af Amer: 27 mL/min — ABNORMAL LOW (ref >60)
GFR calc non Af Amer: 23 mL/min — ABNORMAL LOW (ref >60)
Glucose, Bld: 182 mg/dL — ABNORMAL HIGH (ref 65–99)
Potassium: 4.4 mmol/L (ref 3.5–5.1)
Sodium: 148 mmol/L — ABNORMAL HIGH (ref 135–145)

## 2016-02-12 LAB — OCCULT BLOOD GASTRIC / DUODENUM (SPECIMEN CUP): Occult Blood, Gastric: POSITIVE — AB

## 2016-02-12 MED ORDER — PANTOPRAZOLE SODIUM 20 MG PO TBEC: 20.0000 mg | DELAYED_RELEASE_TABLET | Freq: Two times a day (BID) | ORAL | Status: AC

## 2016-02-12 MED ORDER — ONDANSETRON HCL 4 MG/2ML IJ SOLN
4.0000 mg | Freq: Once | INTRAMUSCULAR | Status: AC
  Administered 2016-02-12: 4 mg via INTRAVENOUS
  Filled 2016-02-12: qty 2

## 2016-02-12 MED ORDER — PANTOPRAZOLE SODIUM 40 MG IV SOLR
40.0000 mg | Freq: Once | INTRAVENOUS | Status: AC
  Administered 2016-02-12: 40 mg via INTRAVENOUS
  Filled 2016-02-12: qty 40

## 2016-02-12 MED ORDER — ONDANSETRON HCL 4 MG PO TABS: 4.0000 mg | ORAL_TABLET | Freq: Four times a day (QID) | ORAL | Status: AC

## 2016-02-12 MED ORDER — SODIUM CHLORIDE 0.9 % IV BOLUS (SEPSIS): 1000.0000 mL | Freq: Once | INTRAVENOUS | Status: DC

## 2016-02-12 MED ORDER — SODIUM CHLORIDE 0.9 % IV SOLN
Freq: Once | INTRAVENOUS | Status: AC
  Administered 2016-02-12: 20:00:00 via INTRAVENOUS

## 2016-02-12 NOTE — Discharge Instructions (Signed)
Dehydration  Dehydration is when you lose more fluids from the body than you take in. Vital organs such as the kidneys, brain, and heart cannot function without a proper amount of fluids and salt. Any loss of fluids from the body can cause dehydration.   Older adults are at a higher risk of dehydration than younger adults. As we age, our bodies are less able to conserve water and do not respond to temperature changes as well. Also, older adults do not become thirsty as easily or quickly. Because of this, older adults often do not realize they need to increase fluids to avoid dehydration.   CAUSES    Vomiting.   Diarrhea.   Excessive sweating.   Excessive urination.   Fever.   Certain medicines, such as blood pressure medicines called diuretics.   Poorly controlled blood sugars.  SIGNS AND SYMPTOMS   Mild dehydration:   Thirst.   Dry lips.   Slightly dry mouth.  Moderate dehydration:   Very dry mouth.   Sunken eyes.   Skin does not bounce back quickly when lightly pinched and released.   Dark urine and decreased urine production.   Decreased tear production.   Headache.  Severe dehydration:   Very dry mouth.   Extreme thirst.   Rapid, weak pulse (more than 100 beats per minute at rest).   Cold hands and feet.   Not able to sweat in spite of heat.   Rapid breathing.   Blue lips.   Confusion and lethargy.   Difficulty being awakened.   Minimal urine production.   No tears.  DIAGNOSIS   Your health care provider will diagnose dehydration based on your symptoms and your exam. Blood and urine tests will help confirm the diagnosis. The diagnostic evaluation should also identify the cause of dehydration.  TREATMENT   Treatment of mild or moderate dehydration can often be done at home by increasing the amount of fluids that you drink. It is best to drink small amounts of fluid more often. Drinking too much at one time can make vomiting worse. Severe dehydration needs to be treated at the hospital.  You may be given IV fluids that contain water and electrolytes.  HOME CARE INSTRUCTIONS    Ask your health care provider about specific rehydration instructions.   Drink enough fluids to keep your urine clear or pale yellow.   Drink small amounts frequently if you have nausea and vomiting.   Eat as you normally do.   Avoid:    Foods or drinks high in sugar.    Carbonated drinks.    Juice.    Extremely hot or cold fluids.    Drinks with caffeine.    Fatty, greasy foods.    Alcohol.    Tobacco.    Overeating.    Gelatin desserts.   Wash your hands well to avoid spreading bacteria and viruses.   Only take over-the-counter or prescription medicines for pain, discomfort, or fever as directed by your health care provider.   Ask your health care provider if you should continue all prescribed and over-the-counter medicines.   Keep all follow-up appointments with your health care provider.  SEEK MEDICAL CARE IF:   You have abdominal pain, and it increases or stays in one area (localizes).   You have a rash, stiff neck, or severe headache.   You are irritable, sleepy, or difficult to awaken.   You are weak, dizzy, or extremely thirsty.   You have a fever.    SEEK IMMEDIATE MEDICAL CARE IF:    You are unable to keep fluids down, or you get worse despite treatment.   You have frequent episodes of vomiting or diarrhea.   You have blood or green matter (bile) in your vomit.   You have blood in your stool, or your stool looks black and tarry.   You have not urinated in 6-8 hours, or you have only urinated a small amount of very dark urine.   You faint.  MAKE SURE YOU:    Understand these instructions.   Will watch your condition.   Will get help right away if you are not doing well or get worse.     This information is not intended to replace advice given to you by your health care provider. Make sure you discuss any questions you have with your health care provider.     Document Released: 01/30/2004 Document  Revised: 11/14/2013 Document Reviewed: 07/17/2013  Elsevier Interactive Patient Education 2016 Elsevier Inc[employer]

## 2016-02-12 NOTE — Progress Notes (Signed)
   Subjective:    Patient ID: Holly Patterson, female    DOB: 28-Nov-1920, 82 y.o.   MRN: [id number]  HPI  82 yo female with persistent Rt pleural effusion in setting of metastatic breast cancer Adm 11/2015, when she  underwent thoracenteses RLE DVT ,now on eliquis   Chief Complaint  Patient presents with  . Follow-up    Pt c/o prod cough brown colored mucus, wheezing and SOB. Denies any sinus pressure/drainage, chest congestion/tightness, fever, nausea or vomiting.    Dr Rip Harbour - oncologist @ [facility] -Notes were reviewed in care everywhere [place] sputum -no fevers or chest pain Dyspnea is worse now, daughter reports very poor appetite, she needs assistance with activities of daily living, is now in a wheelchair  I note that hospice was discussed by her oncologist-but family opted to continue medical care  STUDIES: 01/01 CT chest >> large Rt effusion 01/02 Echo >> mild LVH, EF 60 to [id number], grade 1 diastolic dysfx, mild/mod RV systolic dysfx, PAS 46 mmHg 01/02 Doppler legs >> acute DVT Rt distal femoral vein to popliteal vein 01/02 Rt thoracentesis >> 1300 ml fluid, glucose 83, protein 3.9/5.9, LDH 224/278, WBC 450 (30%L, 67%M, 3%N) Cytology POS metastatic adenoCA  Past Medical History  Diagnosis Date  . History of conization of cervix   . Hypertension   . Hypothyroidism   . Cancer ([place]) rt breast cancer 2004  . Breast cancer ([place])   . Hearing deficit   . Cognitive impairment   . Pleural effusion      Review of Systems neg for any significant sore throat, dysphagia, itching, sneezing, nasal congestion or excess/ purulent secretions, fever, chills, sweats, unintended wt loss, pleuritic or exertional cp, hempoptysis, orthopnea pnd or change in chronic leg swelling.  Also denies presyncope, palpitations, heartburn, abdominal pain, nausea, vomiting, diarrhea or change in bowel or urinary habits, dysuria,hematuria, rash, arthralgias, visual complaints, headache, numbness  or  ataxia.     Objective:   Physical Exam  Gen. Pleasant, well-nourished, in no distress, In wheelchair ENT - no lesions, no post nasal drip Neck: No JVD, no thyromegaly, no carotid bruits Lungs: no use of accessory muscles,Decreased right base without rales or rhonchi  Cardiovascular: Rhythm regular, heart sounds  normal, no murmurs or gallops, no peripheral edema Musculoskeletal: No deformities, no cyanosis or clubbing         Assessment & Plan:

## 2016-02-12 NOTE — Telephone Encounter (Signed)
LMOVM - returning call to Dr. Burt Knack re: setting up thoracentesis for pt.  Dr. Burt Knack to return call to clinic.

## 2016-02-12 NOTE — Patient Instructions (Signed)
STOP eliquis Zpak Referral to thoracic surgery to place catheter in right chest to drain fluid - if appt is delayed we can perform thoracentesis next week

## 2016-02-12 NOTE — Assessment & Plan Note (Addendum)
This is a malignant effusion-1.3 L was removed in 11/2015 with effusion has recurred and she is symptomatic. Referral to thoracic surgery to place catheter in right chest to drain fluid - if appt is delayed we can perform thoracentesis next week  STOP eliquis in anticipation Zpak  I had a detailed discussion with her daughter has also her son who is a doctor in [place] -they were agreeable for Pleurx catheter. I did bring up hospice again -family will discuss but they would like to continue medical care

## 2016-02-12 NOTE — Telephone Encounter (Signed)
Scheduled patient for 1:30pm, called and left detailed message on Dr. Antionette Char voicemail advising her that we have her mom scheduled for 1:30pm, asked her to call back to confirm that she received the appointment information and that she will be able to keep this time.  Awaiting call back.

## 2016-02-12 NOTE — ED Provider Notes (Signed)
CSN: [id number]     Arrival date & time 02/12/16  1819 History   First MD Initiated Contact with Patient 02/12/16 1832     Chief Complaint  Patient presents with  . Hemoptysis     (Consider location/radiation/quality/duration/timing/severity/associated sxs/prior Treatment) HPI   82 year old female brought in for evaluation of possible hematemesis. Noted earlier today by family. Dark brown emesis.  Patient has a history of metastatic cancer and recurrent left pleural effusion. Family reports progressive deconditioning. Worsening appetite. Seems more dyspneic. Sleeping more.No reported fever.No diarrhea.  Past Medical History  Diagnosis Date  . History of conization of cervix   . Hypertension   . Hypothyroidism   . Cancer ([place]) rt breast cancer 2004  . Breast cancer ([place])   . Hearing deficit   . Cognitive impairment   . Pleural effusion    Past Surgical History  Procedure Laterality Date  . Appendectomy  2011  . Cataract extraction, bilateral  2010    left eye  . Breast lumpectomy  2005    right  . Breast biopsy  07/09/2011    Procedure: BREAST BIOPSY;  Surgeon: Haywood Lasso, MD;  Location: [facility];  Service: General;  Laterality: Right;  preop diagnosis abnormal calcifications of right breast.  . Pars plana vitrectomy  10/21/2011    Procedure: PARS PLANA VITRECTOMY WITH 23 GAUGE;  Surgeon: Adonis Brook;  Location: [facility];  Service: Ophthalmology;  Laterality: Left;  Intraocular Lens Exchange  . Cataract extraction    . Dilatation & curettage/hysteroscopy with myosure     Family History  Problem Relation Age of Onset  . Prostate cancer Father   . Nephrotic syndrome Sister   . Breast cancer Sister   . Breast cancer Mother   . Pancreatic cancer Sister    Social History  Substance Use Topics  . Smoking status: Former Research scientist (life sciences)  . Smokeless tobacco: None  . Alcohol Use: No     Comment: "wine once or twice a month"   OB History    No data available     Review of  Systems  All systems reviewed and negative, other than as noted in HPI.   Allergies  Review of patient's allergies indicates no known allergies.  Home Medications   Prior to Admission medications   Medication Sig Start Date End Date Taking? Authorizing Provider  Cholecalciferol (VITAMIN D3) 2000 units capsule Take by mouth.    Historical Provider, MD  docusate sodium (COLACE) 100 MG capsule Take by mouth. Reported on 02/12/2016    Historical Provider, MD  ELIQUIS 2.5 MG TABS tablet Take 1 tablet by mouth 2 (two) times daily. 01/29/16   Historical Provider, MD  levothyroxine (SYNTHROID, LEVOTHROID) 75 MCG tablet Take 75 mcg by mouth daily before breakfast.    Historical Provider, MD  losartan (COZAAR) 50 MG tablet Take by mouth. Reported on 02/12/2016 11/24/12   Historical Provider, MD  Lutein 6 MG CAPS Take by mouth. Reported on 02/12/2016    Historical Provider, MD  megestrol (MEGACE) 40 MG tablet Take 1 tablet (40 mg total) by mouth 4 (four) times daily. 12/16/15   Binnie Rail, MD  Multiple Vitamin (MULTIVITAMIN WITH MINERALS) TABS tablet Take 1 tablet by mouth daily.    Historical Provider, MD   SpO2 96% Physical Exam  Constitutional: She appears well-developed and well-nourished. No distress.  Laying in bed. Appears to be sleeping. Elderly appearing but does not seem distressed.  HENT:  Head: Normocephalic and atraumatic.  Eyes: Conjunctivae  are normal. Right eye exhibits no discharge. Left eye exhibits no discharge.  Neck: Neck supple.  Cardiovascular: Normal rate, regular rhythm and normal heart sounds.  Exam reveals no gallop and no friction rub.   No murmur heard. Pulmonary/Chest: Effort normal and breath sounds normal.  Alt tachypnea. Decreased breath sounds bilaterally.  Abdominal: Soft. She exhibits no distension. There is no tenderness.  Musculoskeletal: She exhibits no edema or tenderness.  Neurological: She is alert.  Skin: Skin is warm and dry.  Psychiatric: She has a  normal mood and affect. Her behavior is normal. Thought content normal.  Nursing note and vitals reviewed.   ED Course  Procedures (including critical care time) Labs Review Labs Reviewed - No data to display  Imaging Review No results found. I have personally reviewed and evaluated these images and lab results as part of my medical decision-making.   EKG Interpretation None      MDM   Final diagnoses:  Hematemesis with nausea  Dehydration    82 year old female with  hematemesis. Was Gastroccult positive. Anticoagulation recently stopped for possible pleural catheter placement. No continued vomiting while in the emergency room. Mild tachycardia noted, but her blood pressure is okay.She seems very tired and has mild tachypnea, but she does not seem distressed. Her hemoglobin today is actually normal. Likely from hemoconcentration. She has had poor oral intake recently. An IV was placed. She was given IV fluids. She was given a dose of Protonix for possible upper GI bleed. We'll discharge her with continued Protonix. Family given offer to admit her for observation and continued hydration. They would prefer to take her home. I feel this is very reasonable. You can tell that she is very loved and has a very supportive family with multiple members at bedside and also calling to check on her status. She is to follow-up with CT surgery tomorrow.    Virgel Manifold, MD 02/18/16 1136

## 2016-02-12 NOTE — ED Notes (Signed)
Pt arrives from home via [facility]. Pt states she had an episode of hemoptysis today that was less than 56mL according to EMS. Pt has a hx of breast cancer and PCP states he thinks the cancer has metastasized. Pt son reports to [facility] he doesn't want his mother to receive and IV. No advanced directive.

## 2016-02-12 NOTE — Telephone Encounter (Signed)
Spoke with Dr. Burt Knack, she confirmed that she and her mom will be here at 1:30 for appointment. Nothing further needed.

## 2016-02-14 ENCOUNTER — Telehealth: Payer: Self-pay | Admitting: Pulmonary Disease

## 2016-02-14 ENCOUNTER — Encounter: Payer: Self-pay | Admitting: Thoracic Surgery (Cardiothoracic Vascular Surgery)

## 2016-02-14 ENCOUNTER — Other Ambulatory Visit: Payer: Self-pay | Admitting: Thoracic Surgery (Cardiothoracic Vascular Surgery)

## 2016-02-14 ENCOUNTER — Other Ambulatory Visit: Payer: Self-pay | Admitting: Pulmonary Disease

## 2016-02-14 ENCOUNTER — Ambulatory Visit
Admission: RE | Admit: 2016-02-14 | Discharge: 2016-02-14 | Disposition: A | Discharge status: Home / Self Care | Payer: Medicare Other | Source: Ambulatory Visit | Attending: Thoracic Surgery (Cardiothoracic Vascular Surgery) | Admitting: Thoracic Surgery (Cardiothoracic Vascular Surgery)

## 2016-02-14 ENCOUNTER — Institutional Professional Consult (permissible substitution) (INDEPENDENT_AMBULATORY_CARE_PROVIDER_SITE_OTHER): Admitting: Thoracic Surgery (Cardiothoracic Vascular Surgery)

## 2016-02-14 VITALS — BP 124/70 | HR 115 | Resp 20 | Ht 62.0 in | Wt 115.0 lb

## 2016-02-14 DIAGNOSIS — J90 Pleural effusion, not elsewhere classified: Secondary | ICD-10-CM

## 2016-02-14 DIAGNOSIS — J948 Other specified pleural conditions: Secondary | ICD-10-CM | POA: Diagnosis not present

## 2016-02-14 DIAGNOSIS — J9610 Chronic respiratory failure, unspecified whether with hypoxia or hypercapnia: Secondary | ICD-10-CM

## 2016-02-14 DIAGNOSIS — C50919 Malignant neoplasm of unspecified site of unspecified female breast: Secondary | ICD-10-CM | POA: Diagnosis not present

## 2016-02-14 NOTE — Progress Notes (Addendum)
PCP is Binnie Rail, MD Referring Provider is Rigoberto Noel, MD  Chief Complaint  Patient presents with  . Pleural Effusion    Surgical eval on Malignant pleural effusion, Chest CT 11/24/15, CXR 02/05/16    HPI: 82 year old woman a malignant right pleural effusion secondary to metastatic breast cancer.  Mrs. Malz is a 82 year old woman with a medical history significant for metastatic breast cancer, hypertension, hypothyroidism, short-term memory loss, DVT, PE, chronic kidney disease, recent gastrointestinal bleed and anemia. She was first found to have a right pleural effusion back in September. This was asymptomatic until December. She had a thoracentesis in January draining 1.3 L of fluid. She had significant improvement afterwards. A follow-up chest x-ray on January 23 showed some recurrence of the effusion, but she was doing relatively well symptomatically that point. Recently over the past several weeks she has become aggressively more short of breath, anorexic, and lethargic. A repeat chest x-ray showed further increase in the right pleural effusion.  She was seen in the emergency department last week with gastrointestinal bleeding and her hemoglobin was found to be 8. Her Eliquis was discontinued about a week ago.  Zubrod Score: At the time of surgery this patient's most appropriate activity status/level should be described as: []     0    Normal activity, no symptoms []     1    Restricted in physical strenuous activity but ambulatory, able to do out light work []     2    Ambulatory and capable of self care, unable to do work activities, up and about >50 % of waking hours                              []     3    Only limited self care, in bed greater than 50% of waking hours [x]     4    Completely disabled, no self care, confined to bed or chair []     5    Moribund    Past Medical History  Diagnosis Date  . History of conization of cervix   . Hypertension   . Hypothyroidism   .  Cancer ([place]) rt breast cancer 2004  . Breast cancer ([place])   . Hearing deficit   . Cognitive impairment   . Pleural effusion     Past Surgical History  Procedure Laterality Date  . Appendectomy  2011  . Cataract extraction, bilateral  2010    left eye  . Breast lumpectomy  2005    right  . Breast biopsy  07/09/2011    Procedure: BREAST BIOPSY;  Surgeon: Haywood Lasso, MD;  Location: [facility];  Service: General;  Laterality: Right;  preop diagnosis abnormal calcifications of right breast.  . Pars plana vitrectomy  10/21/2011    Procedure: PARS PLANA VITRECTOMY WITH 23 GAUGE;  Surgeon: Adonis Brook;  Location: [facility];  Service: Ophthalmology;  Laterality: Left;  Intraocular Lens Exchange  . Cataract extraction    . Dilatation & curettage/hysteroscopy with myosure      Family History  Problem Relation Age of Onset  . Prostate cancer Father   . Nephrotic syndrome Sister   . Breast cancer Sister   . Breast cancer Mother   . Pancreatic cancer Sister     Social History Social History  Substance Use Topics  . Smoking status: Former Research scientist (life sciences)  . Smokeless tobacco: None  . Alcohol Use: No  Comment: "wine once or twice a month"    Current Outpatient Prescriptions  Medication Sig Dispense Refill  . ondansetron (ZOFRAN) 4 MG tablet Take 1 tablet (4 mg total) by mouth every 6 (six) hours. 12 tablet 0  . pantoprazole (PROTONIX) 20 MG tablet Take 1 tablet (20 mg total) by mouth 2 (two) times daily before a meal. 30 tablet 0  . Cholecalciferol (VITAMIN D3) 2000 units capsule Take by mouth. Reported on 02/14/2016    . ELIQUIS 2.5 MG TABS tablet Take 1 tablet by mouth 2 (two) times daily. Reported on 02/14/2016  0  . levothyroxine (SYNTHROID, LEVOTHROID) 75 MCG tablet Take 75 mcg by mouth daily before breakfast. Reported on 02/14/2016    . megestrol (MEGACE) 40 MG tablet Take 1 tablet (40 mg total) by mouth 4 (four) times daily. (Patient not taking: Reported on 02/14/2016) 120 tablet 5  .  Multiple Vitamin (MULTIVITAMIN WITH MINERALS) TABS tablet Take 1 tablet by mouth daily. Reported on 02/14/2016     No current facility-administered medications for this visit.    No Known Allergies  Review of Systems  Constitutional: Positive for activity change and appetite change. Negative for fever and chills.  HENT: Positive for hearing loss and trouble swallowing. Negative for voice change.   Respiratory: Positive for cough and shortness of breath.        Home oxygen  Gastrointestinal: Positive for blood in stool. Negative for abdominal pain.  Genitourinary: Negative for hematuria.       Decreased urine volume, chronic kidney disease  Neurological: Positive for weakness. Negative for seizures and syncope.  Hematological: Negative for adenopathy. Bruises/bleeds easily.  All other systems reviewed and are negative.   BP 124/70 mmHg  Pulse 115  Resp 20  Ht 5\' 2"  (1.575 m)  Wt 115 lb (52.164 kg)  BMI 21.03 kg/m2  SpO2 95% Physical Exam  Constitutional: She appears distressed (Mild).  Elderly debilitated woman with increased work of breathing  HENT:  Head: Normocephalic and atraumatic.  Eyes: Conjunctivae and EOM are normal. No scleral icterus.  Neck: No thyromegaly present.  Cardiovascular: Regular rhythm.   No murmur heard. Tachycardic  Pulmonary/Chest: She has no wheezes.  Increased work of breathing, absent breath sounds right base  Abdominal: Soft. There is no tenderness.  Musculoskeletal: She exhibits no edema.  Lymphadenopathy:    She has no cervical adenopathy.  Neurological:  Lethargic, very hard of hearing, cooperative, generally weak  Skin: Skin is warm and dry.  Vitals reviewed.    Diagnostic Tests: CHEST 2 VIEW  COMPARISON: December 16, 2015  FINDINGS: Stable cardiomediastinal silhouette. Left lung is clear. No pneumothorax is noted. Large right pleural effusion is noted which is increased compared to prior exam. Underlying atelectasis  or infiltrate cannot be excluded. Bony thorax is unremarkable.  IMPRESSION: Large right pleural effusion which is increased compared to prior exam with probable underlying atelectasis or infiltrate.   Electronically Signed  By: Marijo Conception, M.D.  Impression: 82 year old woman with a complicated medical history including metastatic breast cancer with malignant right pleural effusion, DVT, PE, recent upper GI bleed, anemia, and chronic kidney disease. She has recurrent malignant right pleural effusion. She is profoundly short of breath. I think the pleural effusion is a significant contributor to that, but likely not the only source as I suspect her PE and anemia are significant factors as well.  She had a thoracentesis back in January and had symptomatic improvement. It is reasonable to think that if we  can keep her effusion drained she will get some symptom relief and palliation from that.  She is significantly short of breath in the office today and I talked to her family about doing a thoracentesis to hopefully provide some symptom relief. They understand this would be a temporizing procedure. Only draining about a liter to make sure that there still adequate fluid left to do a pleural catheter next week.  We discussed placing an indwelling pleural catheter for ongoing maintenance of her pleural effusion. Honestly, I think this will be very short-term and she appears to be end-stage. I informed the patient and her family of the general nature of the procedure, the use of local anesthesia with minimal intravenous sedation, the ongoing care and maintenance of the catheter. We will plan to keep her overnight after the catheter placement.  I informed them of the indications, risks, benefits, and alternatives. They understand the risk include, but are not limited to bleeding, pneumothorax, infection, liver injury, catheter malposition, catheter occlusion, as well as the possibility of other  unforeseeable complications.  They understand and accept the risks of thoracentesis as well as the pleural catheter and agree to proceed with both procedures.  Plan: Thoracentesis today  Right Pleurx catheter placement on Thursday, 02/20/2016  Melrose Nakayama, MD [facility] [phone]  Procedure note  Using sterile technique and 1% lidocaine local anesthetic (4 mL) right thoracentesis was performed draining 1 L of serous fluid. The patient tolerated the procedure well.  Revonda Standard Roxan Hockey, MD [facility] [phone]

## 2016-02-14 NOTE — Telephone Encounter (Signed)
LMOMTCB x 1 

## 2016-02-14 NOTE — Telephone Encounter (Signed)
Patient's son calling to give me update on patient's visit with Thoracic surgeon. (see notes in Epic from Dr. Roxan Hockey) Patients son wants to have patient evaluated for Oxygen.  Advised him that patient would need to be walked to evaluate her.  Patients son is requesting that home care company come out and do evaluation for patient at her home.  Dr. Elsworth Soho, please advise.

## 2016-02-14 NOTE — Telephone Encounter (Signed)
Patient's son, Dr. Danella Deis, called stating that patient's tank is empty and that he spoke with [employer] and they advised him that if we send an order to them that he can pick up a tank for his mom to use for her to get to her appointment with Thoracic surgeon today at 1pm.  He states that she was removed from Hospice care a week ago, so he needs to get the oxygen tank from [place]. I entered an order for STAT pick up on a travel tank for her to use to get to the appointment today.  Patient's son notified that the order has been entered.  However, [employer] called our office and advised [place] that patient has Medicare and cannot get oxygen through their company because they do not accept Medicare (per [place]).  So [facility] sent the order to [facility] and is discussing with Melissa to try to get a tank for patient so she can have oxygen to take to her appointment. [facility] is awaiting word back from [place].  Called and left message with patient's son advising him of above.    Will send message to [facility] [place] for follow up

## 2016-02-14 NOTE — Telephone Encounter (Signed)
Per Lenna Sciara, can entered order for 02 and patient can private pay. Entered order for patient to private pay for Travel tank.   Called and left message on voicemail advising son that he can pick up tank at [facility] on [address] but he would have to private pay for it. Nothing further needed.

## 2016-02-14 NOTE — Telephone Encounter (Signed)
Okay for home care to evaluate

## 2016-02-17 ENCOUNTER — Telehealth: Payer: Self-pay

## 2016-02-17 NOTE — Telephone Encounter (Signed)
Called to speak with Dr. Chrissie Noa, patient passed away 2023-03-04.  Dr. Chrissie Noa very appreciative for all our office did for Mrs. Chrissie Noa. Will send to Dr. Elsworth Soho as Juluis Rainier

## 2016-02-17 NOTE — Telephone Encounter (Signed)
On 02/17/2016 I received a death certificate from [place] (original). The death certificate is for burial. The patient is a patient of Doctor Billey Gosling. The death certificate will be taken to [facility] this am for signature. On 02/20/2016 I received the death certificate back from Doctor Burns. I got the death certificate ready and called the funeral home to let them know the death certificate is ready to be picked up.

## 2016-02-20 ENCOUNTER — Ambulatory Visit: Admit: 2016-02-20 | Payer: Self-pay | Admitting: Thoracic Surgery (Cardiothoracic Vascular Surgery)

## 2016-02-20 SURGERY — INSERTION, PLEURAL DRAINAGE CATHETER
Anesthesia: Monitor Anesthesia Care | Laterality: Right

## 2016-02-22 DEATH — deceased

## 2016-03-10 ENCOUNTER — Ambulatory Visit: Payer: Medicare Other | Admitting: Hematology and Oncology

## 2017-05-18 IMAGING — DX DG CHEST 2V
2 series · 2 of 2 positions shown · non-contrast
Comparison: 11/26/2015 .

CLINICAL DATA: Pulmonary embolus.  Pleural effusion.

EXAM:
CHEST  2 VIEW

[chest pa]
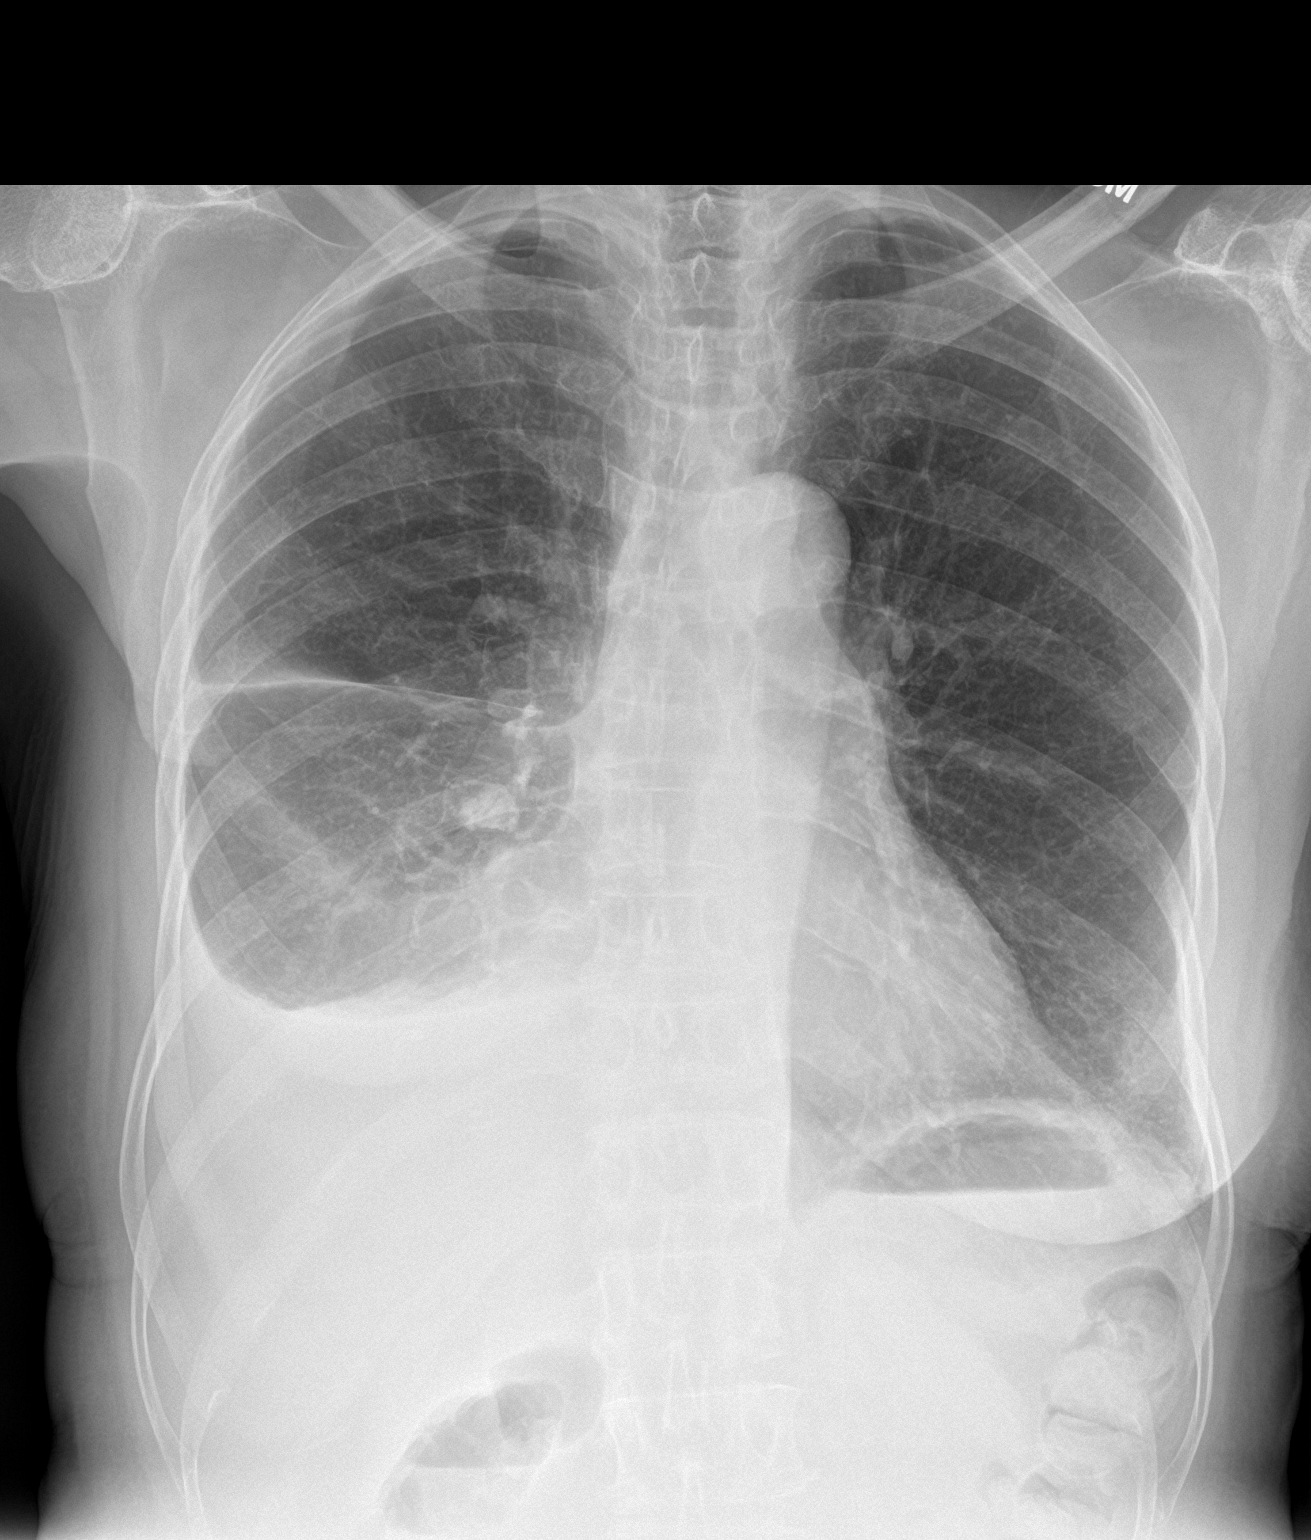

[chest lat]
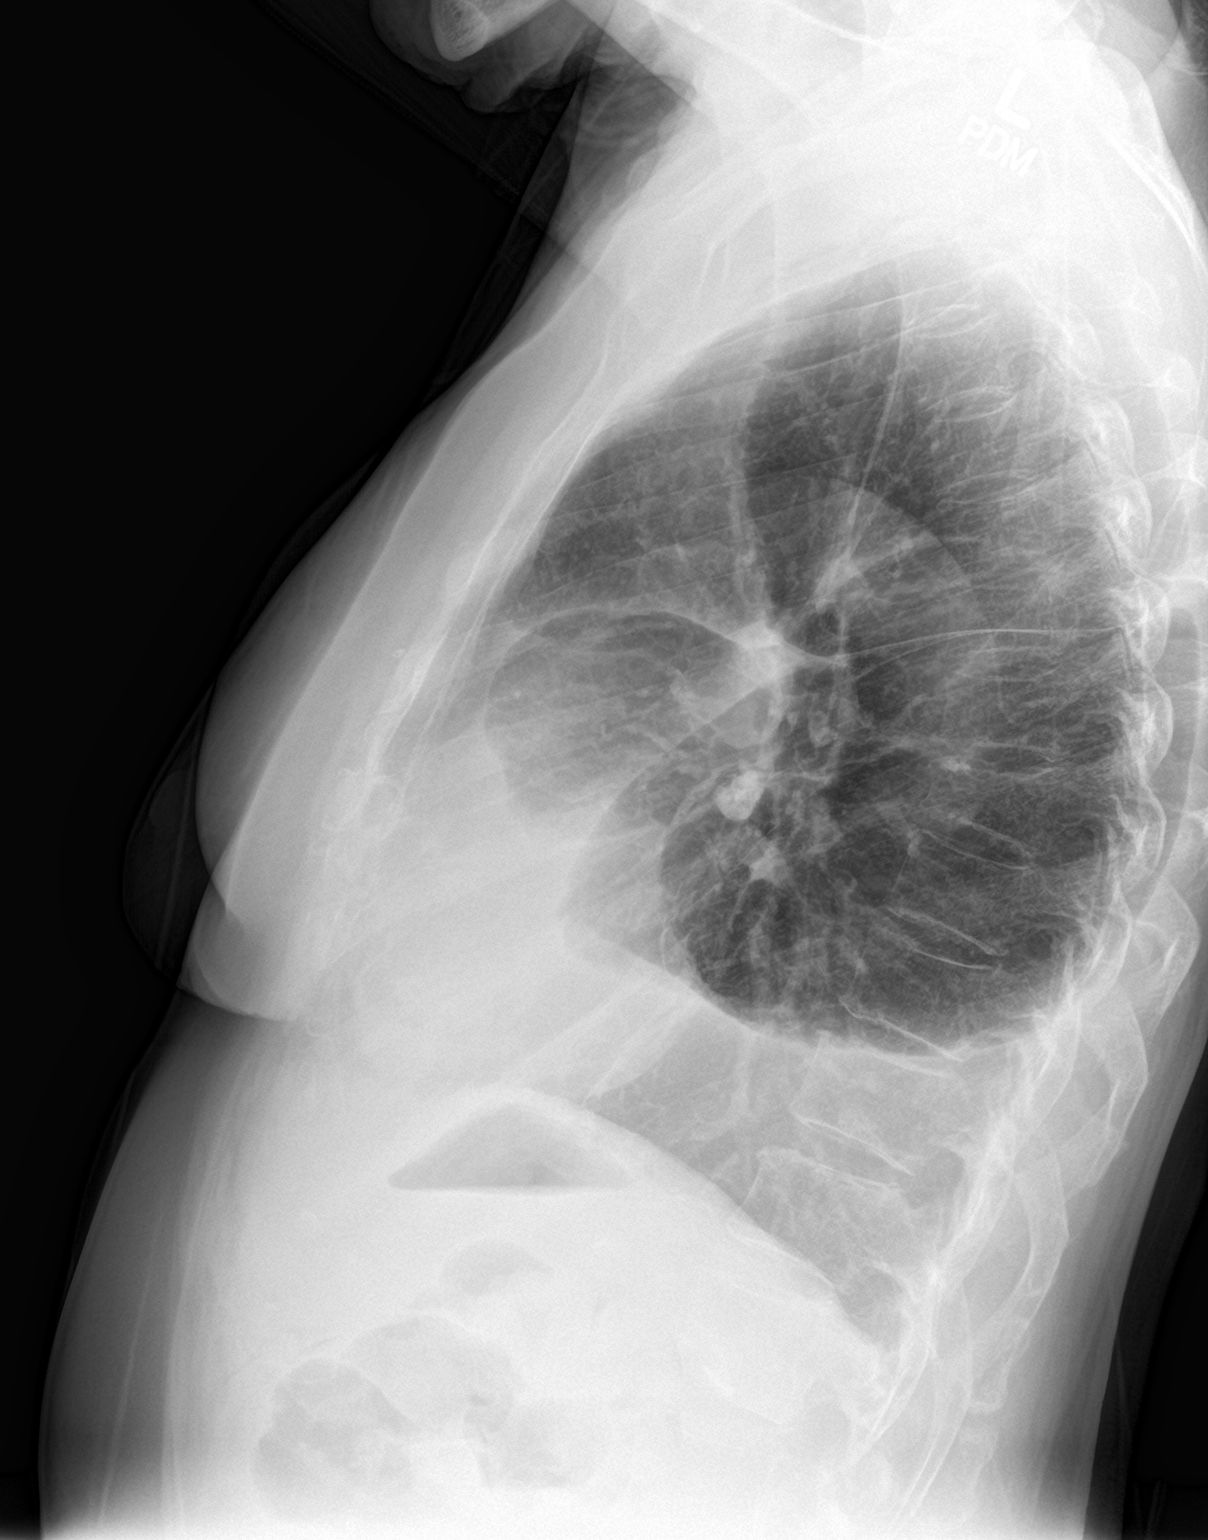

[2 of 2 positions shown; findings below may reference images not displayed]

FINDINGS: Mediastinum and hilar structures normal. Heart size stable. Right
lower lobe infiltrate with right pleural effusion. Mild basilar
atelectasis. No pneumothorax. Diffuse osteopenia degenerative change
P
IMPRESSION: Mild right lower lobe and/or infiltrate with right pleural effusion
again noted. Mild left base subsegmental atelectasis also present .

## 2017-07-17 IMAGING — CR DG CHEST 2V
2 series · 2 of 2 positions shown · non-contrast
Comparison: 02/05/2016

CLINICAL DATA: Status post right thoracentesis

EXAM:
CHEST  2 VIEW

[view not recorded (1 of 2)]
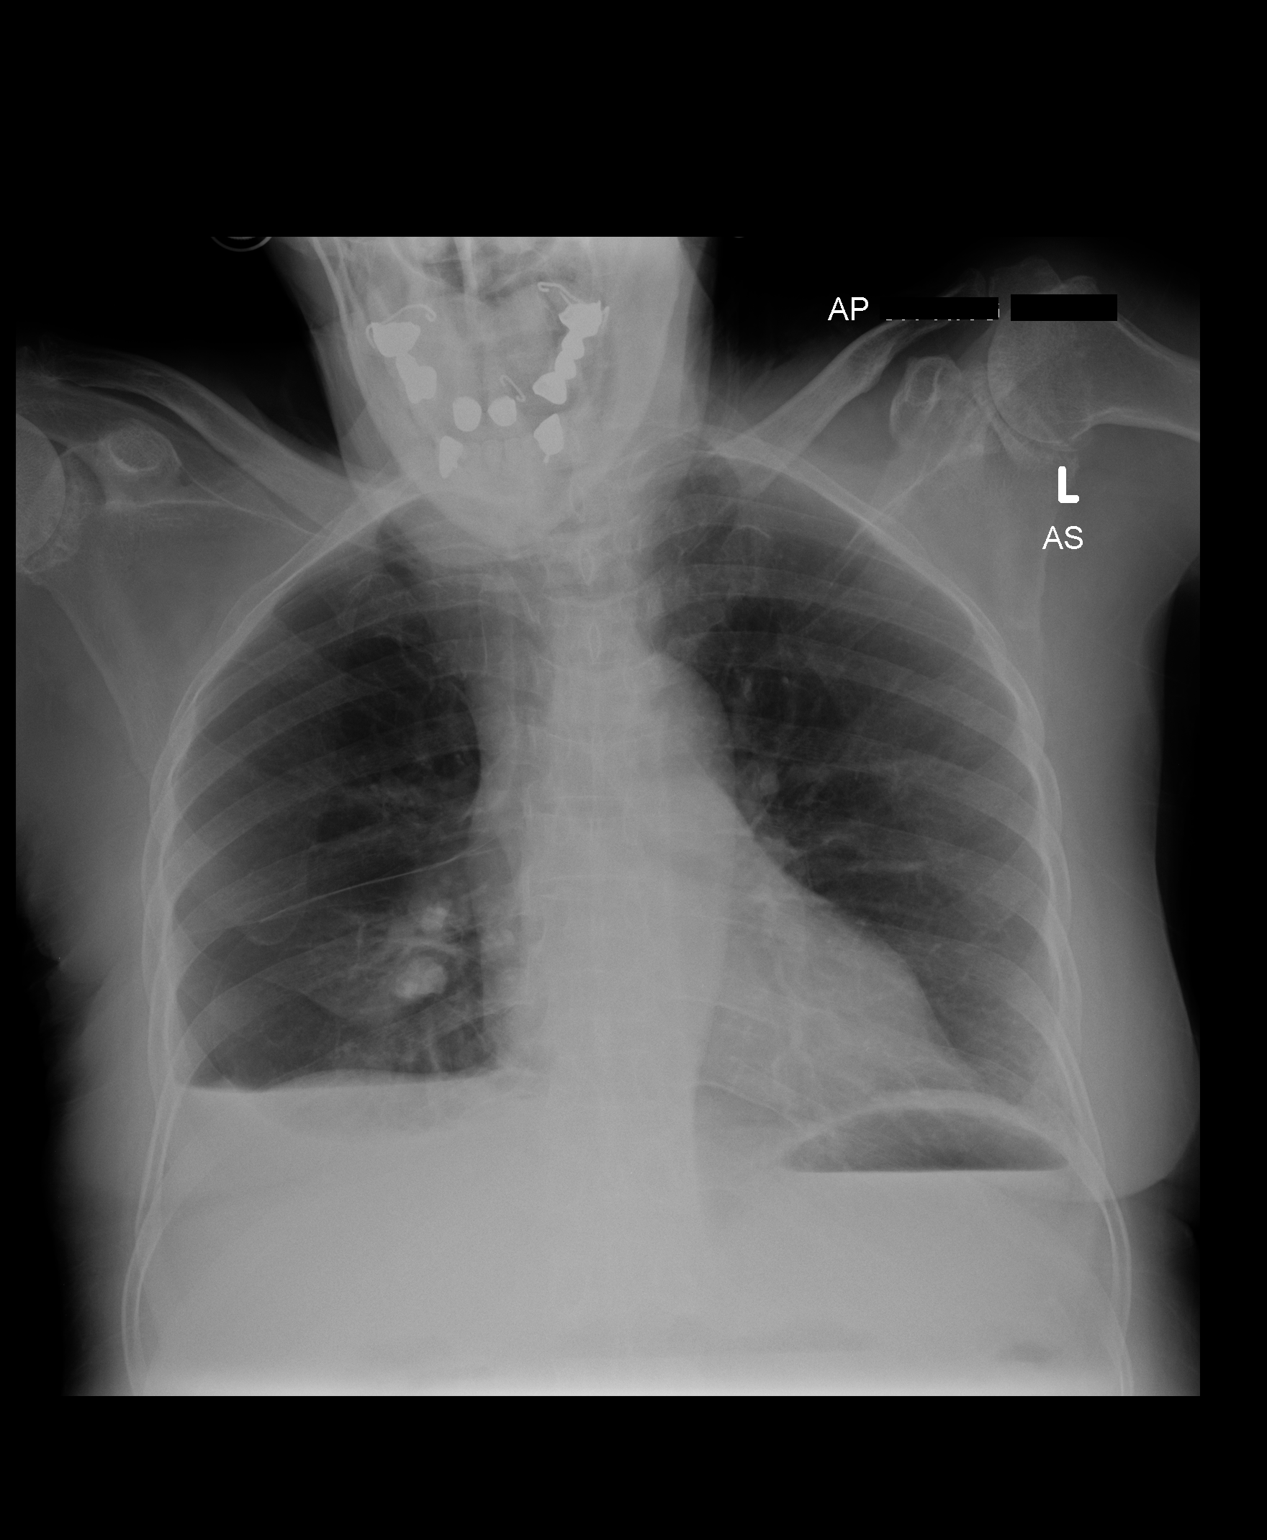

[view not recorded (2 of 2)]
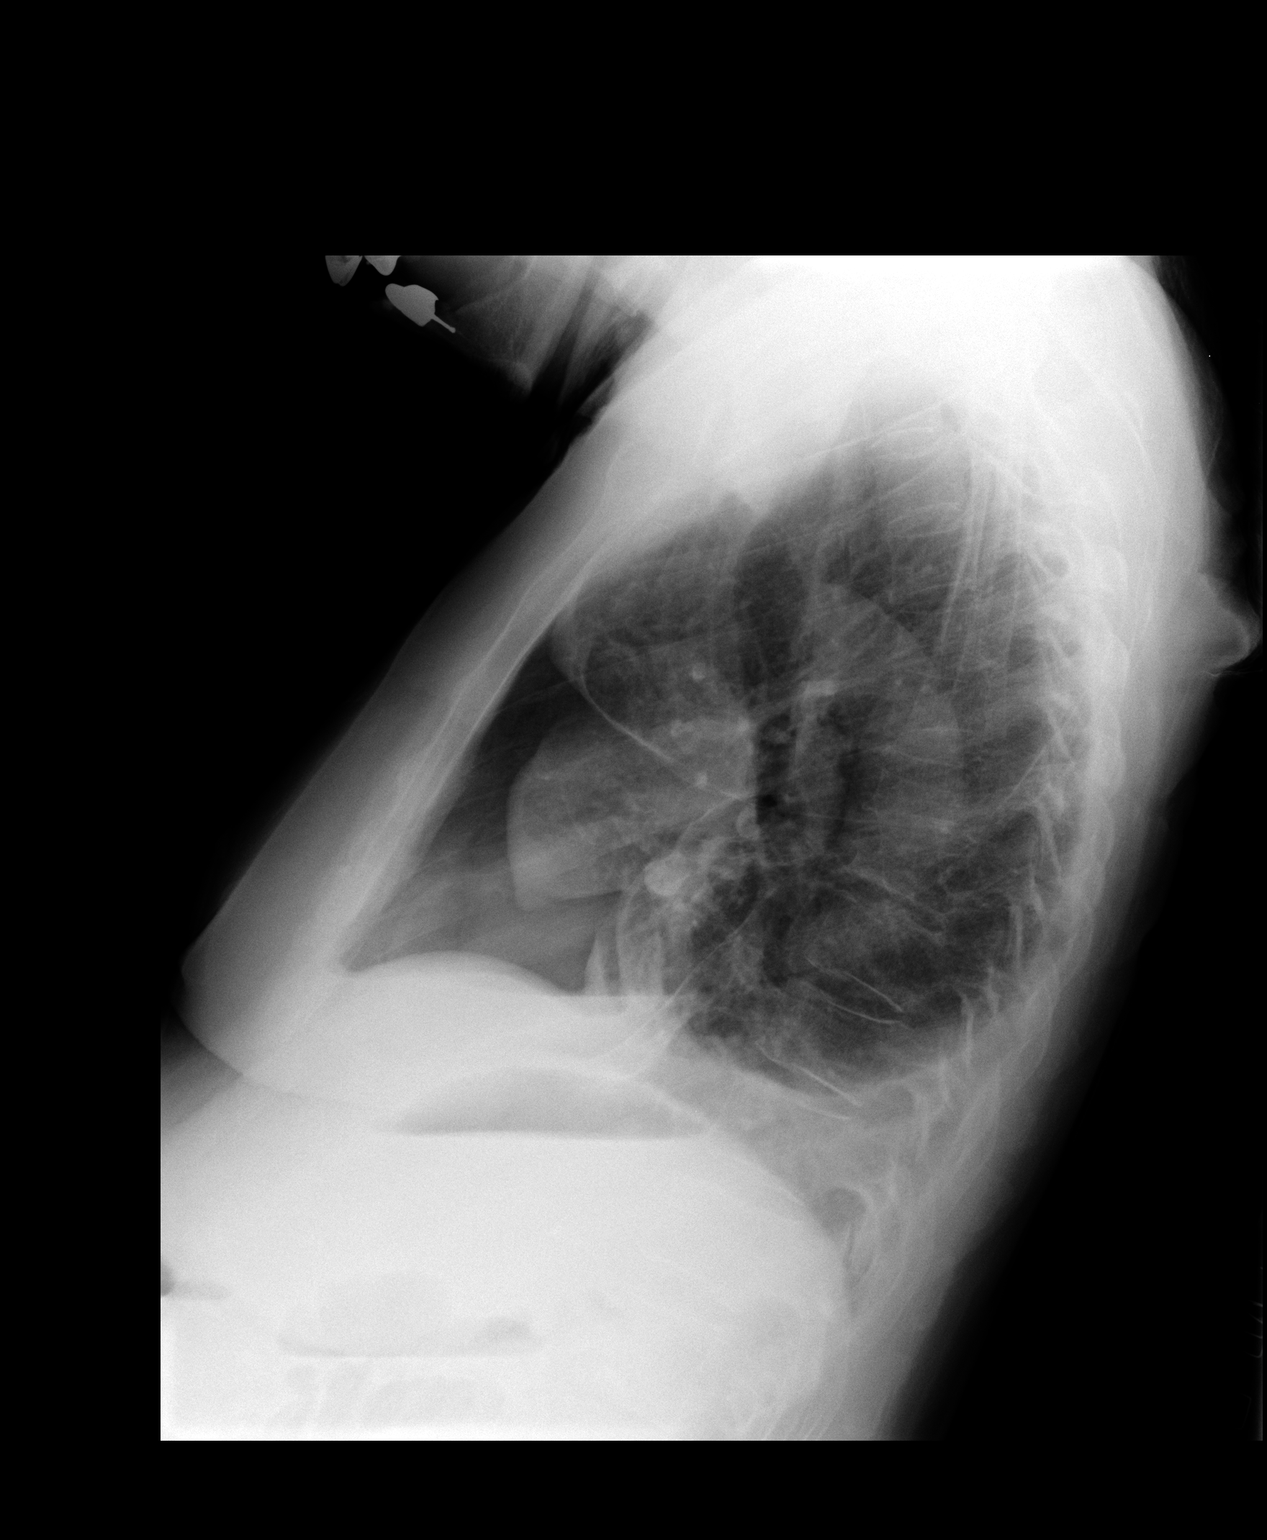

[2 of 2 positions shown; findings below may reference images not displayed]

FINDINGS: Cardiac shadow is stable. The left lung remains clear. There is been
interval thoracentesis on the right with reduction in pleural
effusion. Small effusion remains inferiorly. There is expansion of
the lungs although the lower low is incompletely expanded. This does
not represent a true pneumothorax but rather incomplete re-expansion
of the lung.
IMPRESSION: Status post right thoracentesis with small residual pleural
effusion.

Incomplete re-expansion of the right lower lobe as described. This
will likely expand over time or or the effusion will recur.

These results will be called to the ordering clinician or
representative by the Radiologist Assistant, and communication
documented in the PACS or zVision Dashboard.
